# Patient Record
Sex: Male | Born: 1965
Health system: Southern US, Community
[De-identification: ages and names within clinical notes are randomized; demographics above are authoritative.]

## PROBLEM LIST (undated history)

## (undated) DIAGNOSIS — D649 Anemia, unspecified: Secondary | ICD-10-CM

## (undated) DIAGNOSIS — I10 Essential (primary) hypertension: Secondary | ICD-10-CM

## (undated) DIAGNOSIS — E237 Disorder of pituitary gland, unspecified: Secondary | ICD-10-CM

## (undated) HISTORY — PX: AMPUTATION FINGER: SHX6594

## (undated) HISTORY — PX: SINUS EXPLORATION: SHX5214

---

## 2020-05-20 ENCOUNTER — Telehealth (HOSPITAL_BASED_OUTPATIENT_CLINIC_OR_DEPARTMENT_OTHER): Payer: Self-pay | Admitting: Emergency Medicine

## 2020-05-20 ENCOUNTER — Encounter (HOSPITAL_BASED_OUTPATIENT_CLINIC_OR_DEPARTMENT_OTHER): Payer: Self-pay | Admitting: Emergency Medicine

## 2020-05-20 ENCOUNTER — Inpatient Hospital Stay (HOSPITAL_BASED_OUTPATIENT_CLINIC_OR_DEPARTMENT_OTHER)
Admission: EM | Admit: 2020-05-20 | Discharge: 2020-05-23 | DRG: 811 | Disposition: A | Discharge status: Home / Self Care | Payer: Self-pay | Attending: Internal Medicine | Admitting: Internal Medicine

## 2020-05-20 ENCOUNTER — Inpatient Hospital Stay (HOSPITAL_COMMUNITY): Payer: Self-pay

## 2020-05-20 ENCOUNTER — Other Ambulatory Visit: Payer: Self-pay

## 2020-05-20 ENCOUNTER — Emergency Department (HOSPITAL_BASED_OUTPATIENT_CLINIC_OR_DEPARTMENT_OTHER): Payer: Self-pay

## 2020-05-20 DIAGNOSIS — Z882 Allergy status to sulfonamides status: Secondary | ICD-10-CM

## 2020-05-20 DIAGNOSIS — I517 Cardiomegaly: Secondary | ICD-10-CM | POA: Diagnosis present

## 2020-05-20 DIAGNOSIS — D569 Thalassemia, unspecified: Principal | ICD-10-CM | POA: Diagnosis present

## 2020-05-20 DIAGNOSIS — E669 Obesity, unspecified: Secondary | ICD-10-CM | POA: Diagnosis present

## 2020-05-20 DIAGNOSIS — R778 Other specified abnormalities of plasma proteins: Secondary | ICD-10-CM | POA: Diagnosis present

## 2020-05-20 DIAGNOSIS — I071 Rheumatic tricuspid insufficiency: Secondary | ICD-10-CM | POA: Diagnosis present

## 2020-05-20 DIAGNOSIS — Z79899 Other long term (current) drug therapy: Secondary | ICD-10-CM

## 2020-05-20 DIAGNOSIS — I10 Essential (primary) hypertension: Secondary | ICD-10-CM | POA: Diagnosis present

## 2020-05-20 DIAGNOSIS — D500 Iron deficiency anemia secondary to blood loss (chronic): Secondary | ICD-10-CM | POA: Diagnosis present

## 2020-05-20 DIAGNOSIS — D649 Anemia, unspecified: Secondary | ICD-10-CM | POA: Diagnosis present

## 2020-05-20 DIAGNOSIS — K2971 Gastritis, unspecified, with bleeding: Secondary | ICD-10-CM | POA: Diagnosis present

## 2020-05-20 DIAGNOSIS — K254 Chronic or unspecified gastric ulcer with hemorrhage: Secondary | ICD-10-CM | POA: Diagnosis present

## 2020-05-20 DIAGNOSIS — Z91018 Allergy to other foods: Secondary | ICD-10-CM

## 2020-05-20 DIAGNOSIS — Z6834 Body mass index (BMI) 34.0-34.9, adult: Secondary | ICD-10-CM

## 2020-05-20 DIAGNOSIS — K253 Acute gastric ulcer without hemorrhage or perforation: Secondary | ICD-10-CM

## 2020-05-20 DIAGNOSIS — Z87891 Personal history of nicotine dependence: Secondary | ICD-10-CM

## 2020-05-20 DIAGNOSIS — Z791 Long term (current) use of non-steroidal anti-inflammatories (NSAID): Secondary | ICD-10-CM

## 2020-05-20 DIAGNOSIS — K922 Gastrointestinal hemorrhage, unspecified: Secondary | ICD-10-CM

## 2020-05-20 DIAGNOSIS — U071 COVID-19: Secondary | ICD-10-CM | POA: Diagnosis present

## 2020-05-20 HISTORY — DX: Disorder of pituitary gland, unspecified: E23.7

## 2020-05-20 HISTORY — DX: Anemia, unspecified: D64.9

## 2020-05-20 HISTORY — DX: Essential (primary) hypertension: I10

## 2020-05-20 LAB — CBC WITH DIFFERENTIAL/PLATELET
Abs Immature Granulocytes: 0.06 10*3/uL (ref 0.00–0.07)
Eosinophils Absolute: 0.1 10*3/uL (ref 0.0–0.5)
Eosinophils Relative: 1 %
HCT: 12.3 % — ABNORMAL LOW (ref 39.0–52.0)
Hemoglobin: 2.7 g/dL — CL (ref 13.0–17.0)
Immature Granulocytes: 1 %
Lymphocytes Relative: 14 %
Lymphs Abs: 1.3 10*3/uL (ref 0.7–4.0)
MCH: 11.1 pg — ABNORMAL LOW (ref 26.0–34.0)
MCHC: 22 g/dL — ABNORMAL LOW (ref 30.0–36.0)
MCV: 50.4 fL — ABNORMAL LOW (ref 80.0–100.0)
Monocytes Absolute: 0.6 10*3/uL (ref 0.1–1.0)
Monocytes Relative: 6 %
Neutro Abs: 7.2 10*3/uL (ref 1.7–7.7)
Neutrophils Relative %: 79 %
Platelets: 365 10*3/uL (ref 150–400)
RBC: 2.44 MIL/uL — ABNORMAL LOW (ref 4.22–5.81)
RDW: 24.8 % — ABNORMAL HIGH (ref 11.5–15.5)
Smear Review: NORMAL
WBC: 9.2 10*3/uL (ref 4.0–10.5)

## 2020-05-20 LAB — COMPREHENSIVE METABOLIC PANEL
ALT: 12 U/L (ref 0–44)
AST: 16 U/L (ref 15–41)
Albumin: 3.2 g/dL — ABNORMAL LOW (ref 3.5–5.0)
Alkaline Phosphatase: 63 U/L (ref 38–126)
Anion gap: 11 (ref 5–15)
BUN: 33 mg/dL — ABNORMAL HIGH (ref 6–20)
CO2: 20 mmol/L — ABNORMAL LOW (ref 22–32)
Calcium: 8.2 mg/dL — ABNORMAL LOW (ref 8.9–10.3)
Chloride: 104 mmol/L (ref 98–111)
Creatinine, Ser: 0.79 mg/dL (ref 0.61–1.24)
GFR calc Af Amer: >60 mL/min (ref >60)
GFR calc non Af Amer: >60 mL/min (ref >60)
Glucose, Bld: 121 mg/dL — ABNORMAL HIGH (ref 70–99)
Potassium: 4.1 mmol/L (ref 3.5–5.1)
Sodium: 135 mmol/L (ref 135–145)
Total Bilirubin: 0.9 mg/dL (ref 0.3–1.2)
Total Protein: 6.3 g/dL — ABNORMAL LOW (ref 6.5–8.1)

## 2020-05-20 LAB — CBC
HCT: 11.8 % — ABNORMAL LOW (ref 39.0–52.0)
Hemoglobin: 2.7 g/dL — CL (ref 13.0–17.0)
MCH: 11.4 pg — ABNORMAL LOW (ref 26.0–34.0)
MCHC: 22.9 g/dL — ABNORMAL LOW (ref 30.0–36.0)
MCV: 50 fL — ABNORMAL LOW (ref 80.0–100.0)
Platelets: 356 10*3/uL (ref 150–400)
RBC: 2.36 MIL/uL — ABNORMAL LOW (ref 4.22–5.81)
RDW: 24.6 % — ABNORMAL HIGH (ref 11.5–15.5)
WBC: 8.6 10*3/uL (ref 4.0–10.5)
nRBC: 0.3 % — ABNORMAL HIGH (ref 0.0–0.2)

## 2020-05-20 LAB — URINALYSIS, ROUTINE W REFLEX MICROSCOPIC
Bilirubin Urine: NEGATIVE
Glucose, UA: NEGATIVE mg/dL
Hgb urine dipstick: NEGATIVE
Ketones, ur: NEGATIVE mg/dL
Leukocytes,Ua: NEGATIVE
Nitrite: NEGATIVE
Protein, ur: NEGATIVE mg/dL
Specific Gravity, Urine: <1.005 — ABNORMAL LOW (ref 1.005–1.030)
pH: 5.5 (ref 5.0–8.0)

## 2020-05-20 LAB — OCCULT BLOOD X 1 CARD TO LAB, STOOL: Fecal Occult Bld: POSITIVE — AB

## 2020-05-20 LAB — ABO/RH: ABO/RH(D): A POS

## 2020-05-20 LAB — PROTIME-INR
INR: 1.2 (ref 0.8–1.2)
Prothrombin Time: 14.9 seconds (ref 11.4–15.2)

## 2020-05-20 LAB — LIPASE, BLOOD: Lipase: 22 U/L (ref 11–51)

## 2020-05-20 LAB — SARS CORONAVIRUS 2 BY RT PCR (HOSPITAL ORDER, PERFORMED IN ~~LOC~~ HOSPITAL LAB): SARS Coronavirus 2: POSITIVE — AB

## 2020-05-20 MED ORDER — DIPHENHYDRAMINE HCL 25 MG PO CAPS
25.0000 mg | ORAL_CAPSULE | Freq: Once | ORAL | Status: AC
  Administered 2020-05-21: 25 mg via ORAL
  Filled 2020-05-20: qty 1

## 2020-05-20 MED ORDER — SODIUM CHLORIDE 0.9 % IV SOLN
8.0000 mg/h | INTRAVENOUS | Status: DC
  Administered 2020-05-21 – 2020-05-23 (×4): 8 mg/h via INTRAVENOUS
  Filled 2020-05-20 (×7): qty 80

## 2020-05-20 MED ORDER — MORPHINE SULFATE (PF) 4 MG/ML IV SOLN
4.0000 mg | Freq: Once | INTRAVENOUS | Status: AC
  Administered 2020-05-20: 4 mg via INTRAVENOUS
  Filled 2020-05-20: qty 1

## 2020-05-20 MED ORDER — PANTOPRAZOLE SODIUM 40 MG IV SOLR: 40.0000 mg | Freq: Two times a day (BID) | INTRAVENOUS | Status: DC

## 2020-05-20 MED ORDER — SODIUM CHLORIDE 0.9% IV SOLUTION: Freq: Once | INTRAVENOUS | Status: AC

## 2020-05-20 MED ORDER — IOHEXOL 300 MG/ML  SOLN
100.0000 mL | Freq: Once | INTRAMUSCULAR | Status: AC | PRN
  Administered 2020-05-20: 100 mL via INTRAVENOUS

## 2020-05-20 MED ORDER — ONDANSETRON HCL 4 MG/2ML IJ SOLN
4.0000 mg | Freq: Once | INTRAMUSCULAR | Status: AC
  Administered 2020-05-20: 4 mg via INTRAVENOUS
  Filled 2020-05-20: qty 2

## 2020-05-20 MED ORDER — ACETAMINOPHEN 325 MG PO TABS
650.0000 mg | ORAL_TABLET | Freq: Once | ORAL | Status: AC
  Administered 2020-05-21: 650 mg via ORAL
  Filled 2020-05-20: qty 2

## 2020-05-20 MED ORDER — SODIUM CHLORIDE 0.9% FLUSH
3.0000 mL | Freq: Once | INTRAVENOUS | Status: DC
  Filled 2020-05-20: qty 3

## 2020-05-20 MED ORDER — PANTOPRAZOLE SODIUM 40 MG IV SOLR
INTRAVENOUS | Status: AC
  Administered 2020-05-20: 80 mg
  Filled 2020-05-20: qty 80

## 2020-05-20 MED ORDER — SODIUM CHLORIDE 0.9 % IV SOLN
80.0000 mg | Freq: Once | INTRAVENOUS | Status: AC
  Administered 2020-05-20: 18:00:00 80 mg via INTRAVENOUS
  Filled 2020-05-20: qty 80

## 2020-05-20 NOTE — Plan of Care (Addendum)
54 yo thalasemia Hg 2.7 reports fatigue and abdominal pain  hemocult positive CT abd neg protonix 80 mg  No ETOH Recommended consult to oncology [facility] through smartchat ED to ED transfer to [facility] for transfusion Please page TRH on arrival to ER  accept to stepdown, [facility] Shawn Hamilton 7:38 PM  ER provider spoke to  Dr. Clelia Croft who said normal transfusion is fine.  Please reconsult if questions. 7:54 PM

## 2020-05-20 NOTE — H&P (Signed)
Shawn Hamilton [id number] DOB: [birth year] DOA: 05/20/2020     PCP: Patient, No Pcp Per   Outpatient Specialists:   NONE    Patient arrived to ER on 05/20/20 at 1454 Referred by Attending Therisa Doyneoutova, Amisha Pospisil, MD   Patient coming from: home Lives alone,     Chief Complaint:   Chief Complaint  Patient presents with  . Abdominal Pain  . Emesis    HPI: Shawn DickensCraig Hamilton is a 54 y.o. male with medical history significant of  thalasemia     Presented with   nausea vomiting abdominal pain and shortness of breath He has become so tired he has hard time walking to his car.  Patient states that he has not had any insurance so he could not check himself for quite some time.  He has been vomiting black material and that had triggered him to come to emergency department.  He has been taking Aleve's every 2 hours for the past few years for shoulder pain.  He is unsure if he had any melena because he does not look. Patient has known history of anemia but not sure what his hemoglobin usually is at Patient travels and have been in the area only for the past 2 wks He has congenital absence of the right hand Infectious risk factors:  Reports  shortness of breath  N/V abdominal pain,   severe fatigue    Has  NOt been vaccinated against COVID (not interested)   Initial COVID TEST   POSITIVE,     Lab Results  Component Value Date   SARSCOV2NAA POSITIVE (A) 05/20/2020     Regarding pertinent Chronic problems:     HTN   not on medications   Chronic anemia -due to thalassemia unclear what his baseline hemoglobin is Recent Labs    05/20/20 1511 05/20/20 1554  HGB 2.7* 2.7*    While in ER: Noted to have hemoglobin down to 2.7 and Hemoccult positive from below.   ER Provider Called: Oncology   Dr. Clelia CroftShadad They Recommend admit to medicine transfuse is not any specific changes. Reconsult in a.m. if needed  ER Provider smart [facility] msg  Gi    Will see in AM     Hospitalist was called for  admission for symptomatic anemia incidental Covid infection and upper GI bleed  The following Work up has been ordered so far:  Orders Placed This Encounter  Procedures  . SARS Coronavirus 2 by RT PCR (hospital order, [facility]) Nasopharyngeal Nasopharyngeal Swab  . CT ABDOMEN PELVIS W CONTRAST  . Lipase, blood  . Comprehensive metabolic panel  . CBC  . Urinalysis, Routine w reflex microscopic  . CBC with Differential  . Occult blood card to lab, stool Provider will collect  . Protime-INR  . Vitamin B12  . Folate  . Iron and TIBC  . Ferritin  . Reticulocytes  . HIV Antibody (routine testing w rflx)  . C-reactive protein  . D-dimer, quantitative (not at [facility])  . Fibrinogen  . Lactate dehydrogenase  . Procalcitonin  . CBC  . Diet NPO time specified  . Saline Lock IV, Maintain IV access  . Cardiac monitoring  . Novel Coronavirus PPE supplies (droplet and contact precautions) yellow stethoscopes, surgical mask, gowns, surgical caps, face shield, goggles, CAPR - on the floor/unit, cleaning [employer]oth (orange and purple top)  . Place COVID-19 isolation sign and PPE checklist outside the DOOR  . Do not give nonsteroidal anti-inflammatory drugs (NSAIDs)  .  Patient to wear surgical mask during transportation  . Place working phone next to the patient  . Initiate Oral Care Protocol  . Initiate Carrier Fluid Protocol  . Cardiac Monitoring - Continuous Indefinite  . CBC post transfusion - RN to place lab order with appropriate draw time  . Consult to hospitalist  ALL PATIENTS BEING ADMITTED/HAVING PROCEDURES NEED COVID-19 SCREENING Return call to [phone]  . Consult to hematology  ALL PATIENTS BEING ADMITTED/HAVING PROCEDURES NEED COVID-19 SCREENING  . Airborne and Contact precautions  . Incentive spirometry  . Flutter valve  . EKG 12-Lead  . EKG 12-Lead  . Type and screen [facility]  . ABO/Rh  . Prepare RBC (crossmatch)  . Insert  saline lock  . Admit to Inpatient (patient's expected length of stay will be greater than 2 midnights or inpatient only procedure)  . Admit to Inpatient (patient's expected length of stay will be greater than 2 midnights or inpatient only procedure)    Following Medications were ordered in ER: Medications  sodium chloride flush (NS) 0.9 % injection 3 mL (3 mLs Intravenous Not Given 05/20/20 1511)  pantoprazole (PROTONIX) 80 mg in sodium chloride 0.9 % 100 mL (0.8 mg/mL) infusion (has no administration in time range)  pantoprazole (PROTONIX) injection 40 mg (has no administration in time range)  0.9 %  sodium chloride infusion (Manually program via Guardrails IV Fluids) (0 mLs Intravenous Hold 05/20/20 2340)  acetaminophen (TYLENOL) tablet 650 mg (has no administration in time range)  diphenhydrAMINE (BENADRYL) capsule 25 mg (has no administration in time range)  morphine 4 MG/ML injection 4 mg (4 mg Intravenous Given 05/20/20 2036)  ondansetron (ZOFRAN) injection 4 mg (4 mg Intravenous Given 05/20/20 2036)  iohexol (OMNIPAQUE) 300 MG/ML solution 100 mL (100 mLs Intravenous Contrast Given 05/20/20 1734)  pantoprazole (PROTONIX) 80 mg in sodium chloride 0.9 % 100 mL IVPB (0 mg Intravenous Stopped 05/20/20 1847)  pantoprazole (PROTONIX) 40 MG injection (80 mg  Given 05/20/20 1803)        Consult Orders  (From admission, onward)         Start     Ordered   05/20/20 1814  Consult to hospitalist  ALL PATIENTS BEING ADMITTED/HAVING PROCEDURES NEED COVID-19 SCREENING Return call to [phone] Called carelink spoke with Cala Bradford  Once       Comments: ALL PATIENTS BEING ADMITTED/HAVING PROCEDURES NEED COVID-19 SCREENING  Return call to [phone]  Provider:  (Not yet assigned)  Question Answer Comment  Place call to: [facility] (Return call to [phone])   Reason for Consult Admit      05/20/20 1819           Significant initial  Findings: Abnormal Labs Reviewed  SARS CORONAVIRUS 2 BY RT PCR  (HOSPITAL ORDER, PERFORMED IN [facility] LAB) - Abnormal; Notable for the following components:      Result Value   SARS Coronavirus 2 POSITIVE (*)    All other components within normal limits  COMPREHENSIVE METABOLIC PANEL - Abnormal; Notable for the following components:   CO2 20 (*)    Glucose, Bld 121 (*)    BUN 33 (*)    Calcium 8.2 (*)    Total Protein 6.3 (*)    Albumin 3.2 (*)    All other components within normal limits  CBC - Abnormal; Notable for the following components:   RBC 2.36 (*)    Hemoglobin 2.7 (*)    HCT 11.8 (*)    MCV 50.0 (*)  MCH 11.4 (*)    MCHC 22.9 (*)    RDW 24.6 (*)    nRBC 0.3 (*)    All other components within normal limits  URINALYSIS, ROUTINE W REFLEX MICROSCOPIC - Abnormal; Notable for the following components:   Specific Gravity, Urine <1.005 (*)    All other components within normal limits  CBC WITH DIFFERENTIAL/PLATELET - Abnormal; Notable for the following components:   RBC 2.44 (*)    Hemoglobin 2.7 (*)    HCT 12.3 (*)    MCV 50.4 (*)    MCH 11.1 (*)    MCHC 22.0 (*)    RDW 24.8 (*)    All other components within normal limits  OCCULT BLOOD X 1 CARD TO LAB, STOOL - Abnormal; Notable for the following components:   Fecal Occult Bld POSITIVE (*)    All other components within normal limits     Otherwise labs showing:     Recent Labs  Lab 05/20/20 1511  NA 135  K 4.1  CO2 20*  GLUCOSE 121*  BUN 33*  CREATININE 0.79  CALCIUM 8.2*    Cr   stable,   Lab Results  Component Value Date   CREATININE 0.79 05/20/2020    Recent Labs  Lab 05/20/20 1511  AST 16  ALT 12  ALKPHOS 63  BILITOT 0.9  PROT 6.3*  ALBUMIN 3.2*   Lab Results  Component Value Date   CALCIUM 8.2 (L) 05/20/2020    WBC      Component Value Date/Time   WBC 9.2 05/20/2020 1554   ANC    Component Value Date/Time   NEUTROABS 7.2 05/20/2020 1554   ALC No components found for: LYMPHAB    Plt: Lab Results  Component Value Date    PLT 365 05/20/2020      Procalcitonin  Ordered   COVID-19 Labs  No results for input(s): DDIMER, FERRITIN, LDH, CRP in the last 72 hours.  Lab Results  Component Value Date   SARSCOV2NAA POSITIVE (A) 05/20/2020   HG/HCT    Down     Component Value Date/Time   HGB 2.7 (LL) 05/20/2020 1554   HCT 12.3 (L) 05/20/2020 1554    Recent Labs  Lab 05/20/20 1511  LIPASE 22   No results for input(s): AMMONIA in the last 168 hours.    Troponin   ordered     ECG: Ordered Personally reviewed by me showing: HR : 93 Rhythm:  NSR   no evidence of ischemic changes QTC 466    UA  no evidence of UTI     Urine analysis:    Component Value Date/Time   COLORURINE YELLOW 05/20/2020 1511   APPEARANCEUR CLEAR 05/20/2020 1511   LABSPEC <1.005 (L) 05/20/2020 1511   PHURINE 5.5 05/20/2020 1511   GLUCOSEU NEGATIVE 05/20/2020 1511   HGBUR NEGATIVE 05/20/2020 1511   BILIRUBINUR NEGATIVE 05/20/2020 1511   KETONESUR NEGATIVE 05/20/2020 1511   PROTEINUR NEGATIVE 05/20/2020 1511   NITRITE NEGATIVE 05/20/2020 1511   LEUKOCYTESUR NEGATIVE 05/20/2020 1511       Ordered   CXR - NON acute  CTabd/pelvis -   nonacute      ED Triage Vitals  Enc Vitals Group     BP 05/20/20 1507 (!) 170/76     Pulse Rate 05/20/20 1507 90     Resp 05/20/20 1507 18     Temp 05/20/20 1507 98.4 F (36.9 C)     Temp Source 05/20/20 1507 Oral  SpO2 05/20/20 1507 100 %     Weight 05/20/20 1508 235 lb (106.6 kg)     Height 05/20/20 1508 5\' 9"  (1.753 m)     Head Circumference --      Peak Flow --      Pain Score 05/20/20 1508 5     Pain Loc --      Pain Edu? --      Excl. in GC? --   TMAX(24)@       Latest  Blood pressure (!) 162/42, pulse 97, temperature 98.4 F (36.9 C), resp. rate 20, height 5\' 9"  (1.753 m), weight 106.6 kg, SpO2 98 %.    Review of Systems:    Pertinent positives include: , fatigue, abdominal pain, nausea, vomiting,  hematemesis Constitutional:  No weight loss, night sweats,  Fevers, chillsweight loss  HEENT:  No headaches, Difficulty swallowing,Tooth/dental problems,Sore throat,  No sneezing, itching, ear ache, nasal congestion, post nasal drip,  Cardio-vascular:  No chest pain, Orthopnea, PND, anasarca, dizziness, palpitations.no Bilateral lower extremity swelling  GI:  No heartburn, indigestion,  diarrhea, change in bowel habits, loss of appetite, melena, blood in stool, Resp:  no shortness of breath at rest. No dyspnea on exertion, No excess mucus, no productive cough, No non-productive cough, No coughing up of blood.No change in color of mucus.No wheezing. Skin:  no rash or lesions. No jaundice GU:  no dysuria, change in color of urine, no urgency or frequency. No straining to urinate.  No flank pain.  Musculoskeletal:  No joint pain or no joint swelling. No decreased range of motion. No back pain.  Psych:  No change in mood or affect. No depression or anxiety. No memory loss.  Neuro: no localizing neurological complaints, no tingling, no weakness, no double vision, no gait abnormality, no slurred speech, no confusion  All systems reviewed and apart from HOPI all are negative  Past Medical History:   Past Medical History:  Diagnosis Date  . Anemia   . Hypertension   . 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 AMPUTATION FINGER    . SINUS EXPLORATION      Social History:  Ambulatory    Independently     reports that he has quit smoking. He has never used smokeless tobacco. He reports current alcohol use. He reports that he does not use drugs.     Family History:   Family History  Problem Relation Age of Onset  . Diabetes Neg Hx   . Hypertension Neg Hx      Allergies: Allergies  Allergen Reactions  . Alfalfa Itching  . Sulfa Antibiotics Other (See Comments)    Upset stomach      Prior to Admission medications   Medication Sig Start Date End Date Taking? Authorizing Provider   acetaminophen (TYLENOL) 500 MG tablet Take 1,000 mg by mouth every 6 (six) hours as needed for mild pain.   Yes [provider]  Doxylamine Succinate, Sleep, (SLEEP AID PO) Take 1 tablet by mouth at bedtime as needed (sleep). Generic "Sleep-ease"   Yes [provider]  naproxen sodium (ALEVE) 220 MG tablet Take 880 mg by mouth 2 (two) times daily as needed (pain).   Yes [provider]  Simethicone (GAS-X PO) Take 1 tablet by mouth daily as needed (gas relief).   Yes [provider]   Physical Exam: Vitals with BMI 05/20/2020 05/20/2020 05/20/2020  Height - - -  Weight - - -  BMI - - -  Systolic 162 - 145  Diastolic 42 - 62  Pulse 97 99 98     1. General:  in No Acute distress   Chronically ill  -appearing 2. Psychological: Alert and  Oriented 3. Head/ENT:     Dry Mucous Membranes pale                          Head Non traumatic, neck supple                           Poor Dentition 4. SKIN:   decreased Skin turgor,  Skin clean Dry and intact no rash 5. Heart: Regular rate and rhythm no   Murmur, no Rub or gallop 6. Lungs:  no wheezes or crackles   7. Abdomen: Soft,  non-tender, Non distended   obese  8. Lower extremities: no clubbing, cyanosis, no   edema 9. Neurologically Grossly intact, moving all 4 extremities equally  10. MSK: Normal range of motion   All other LABS:     Recent Labs  Lab 05/20/20 1511 05/20/20 1554  WBC 8.6 9.2  NEUTROABS  --  7.2  HGB 2.7* 2.7*  HCT 11.8* 12.3*  MCV 50.0* 50.4*  PLT 356 365     Recent Labs  Lab 05/20/20 1511  NA 135  K 4.1  CL 104  CO2 20*  GLUCOSE 121*  BUN 33*  CREATININE 0.79  CALCIUM 8.2*     Recent Labs  Lab 05/20/20 1511  AST 16  ALT 12  ALKPHOS 63  BILITOT 0.9  PROT 6.3*  ALBUMIN 3.2*       Cultures: No results found for: SDES, SPECREQUEST, CULT, REPTSTATUS   Radiological Exams on Admission: CT ABDOMEN PELVIS W CONTRAST  Result Date: 05/20/2020 CLINICAL DATA:   Upper abdominal pain with nausea and vomiting. EXAM: CT ABDOMEN AND PELVIS WITH CONTRAST TECHNIQUE: Multidetector CT imaging of the abdomen and pelvis was performed using the standard protocol following bolus administration of intravenous contrast. CONTRAST:  OMNIPAQUE IOHEXOL 300 MG/ML  SOLN COMPARISON:  None. FINDINGS: Lower chest: No acute abnormality. Hepatobiliary: No focal liver abnormality is seen. No gallstones, gallbladder wall thickening, or biliary dilatation. Pancreas: Unremarkable. No pancreatic ductal dilatation or surrounding inflammatory changes. Spleen: Normal in size without focal abnormality. Adrenals/Urinary Tract: Adrenal glands are unremarkable. Kidneys are normal, without renal calculi, focal lesion, or hydronephrosis. Bladder is unremarkable. Stomach/Bowel: Stomach is within normal limits. The appendix is not clearly identified. No evidence of bowel wall thickening, distention, or inflammatory changes. Vascular/Lymphatic: There is mild calcification of the abdominal aorta and bilateral common iliac arteries without evidence of aneurysmal dilatation. No enlarged abdominal or pelvic lymph nodes. Reproductive: Prostate is unremarkable. Other: No abdominal wall hernia or abnormality. No abdominopelvic ascites. Musculoskeletal: No acute or significant osseous findings. IMPRESSION: 1. No acute intra-abdominal findings. 2. Aortic atherosclerosis. Aortic Atherosclerosis (ICD10-I70.0). Electronically Signed   By: Aram Candela M.D.   On: 05/20/2020 18:07    Chart has been reviewed    Assessment/Plan  54 y.o. male with medical history significant of  thalasemia  Admitted for symptomatic anemia in the setting of upper GI blood loss history for ischemia and incidental finding  Of Covid infection  Present on Admission: . Severe anemia -likely need repeated transfusions.  Continue to follow CBC after blood administration.  Supportive management Will need to establish and  follow-up as  an outpatient regarding history of thalassemia  . COVID-19 virus infection -at this point asymptomatic we will continue to monitor obtain chest x-ray to see if there is any evidence of Covid pneumonia.  Hold off on steroids as patient is symptomatic no evidence of hypoxia or pulmonary infiltrates.  Inflammatory markers unremarkable hold off on remdesivir for now Obtain inflammatory markers.   Monitor for any evidence of significant progression.  Marland Kitchen Upper GI bleed -  - Glasgow Blatchford score BUN >18.2  , Hg  57M  melena     >1 Justifies admission and aggressive management      Modifying risk factors include:    NSAIDS use        -    AIMS 65 =     TOTAL of 0            reassuring           Admit to stepdown given severity of anemia    - ER  Provider msg gastroenterology (  LB) they will see patient in a.m. appreciate their consult   - serial CBC.    - Monitor for any recurrence,  evidence of hemodynamic instability or significant blood loss  - Transfuse  for hemoglobin below 7    - Establish at least 2 PIV and fluid resuscitate   - clear liquids for tonight keep nothing by mouth post midnight,   -  administer Protonix drip    . Essential hypertension -not taking any home medications continue to monitor  . Thalassemia -chronic: Need to establish care with oncology as an outpatient  Cardiomegaly and slightly elevated troponin stable no chest pain.  Continue to monitor obtain echogram Other plan as per orders.  DVT prophylaxis:  SCD     Code Status:    Code Status: Not on file FULL CODE as per patient  I had personally discussed CODE STATUS with patient     Family Communication:   Family not at  Bedside   Disposition Plan:       To home once workup is complete and patient is stable   Following barriers for discharge:                                                          Anemia corrected                                                        Will need to be able to  tolerate PO                            Will likely need home health, home O2, set up                           Will need consultants to evaluate patient prior to discharge  Consults called: Gi aware LB on call  Hematology made aware reconsult if needed    Admission status:  ED Disposition    ED Disposition Condition Comment   Admit  [facility]ospital Area: [facility] [[id number]]  Level of Care: Progressive [102]  Admit to Progressive based on following criteria: MULTISYSTEM THREATS such as stable sepsis, metabolic/electrolyte imbalance with or without encephalopathy that is responding to early treatment.  Admit to Progressive based on following criteria: GI, ENDOCRINE disease patients with GI bleeding, acute liver failure or pancreatitis, stable with diabetic ketoacidosis or thyrotoxicosis (hypothyroid) state.  Covid Evaluation: Confirmed COVID Positive  Diagnosis: Severe anemia [[id number]]  Admitting Physician: Therisa Doyne [[id number]]  Attending Physician: Therisa Doyne [[id number]]  Estimated length of stay: past midnight tomorrow  Certification:: I certify this patient will need inpatient services for at least 2 midnights          inpatient     I Expect 2 midnight stay secondary to severity of patient's current illness need for inpatient interventions justified by the following:     Severe lab/radiological/exam abnormalities including:    severe Anemia and extensive comorbidities including: thalasemia That are currently affecting medical management.   I expect  patient to be hospitalized for 2 midnights requiring inpatient medical care.  Patient is at high risk for adverse outcome (such as loss of life or disability) if not treated.  Indication for inpatient stay as follows:    inability to maintain oral hydration    Need for operative/procedural  intervention    Need for  IV fluids,    Level of care      SDU tele  indefinitely please discontinue once patient no longer qualifies COVID-19 Labs     Lab Results  Component Value Date   SARSCOV2NAA POSITIVE (A) 05/20/2020     Precautions: admitted as covid positive Airborne and Contact precautions    PPE: Used by the provider:   P100  eye Goggles,  Gloves  gown     Shawn Hamilton 05/21/2020, 1:26 AM    [facility]     after 2 AM please page floor coverage PA If 7AM-7PM, please contact the day team taking care of the patient using [email or web address]   Patient was evaluated in the context of the global COVID-19 pandemic, which necessitated consideration that the patient might be at risk for infection with the SARS-CoV-2 virus that causes COVID-19. Institutional protocols and algorithms that pertain to the evaluation of patients at risk for COVID-19 are in a state of rapid change based on information released by regulatory bodies including the CDC and federal and state organizations. These policies and algorithms were followed during the patient's care.

## 2020-05-20 NOTE — ED Notes (Signed)
Report called to Leta Jungling, Press photographer at [employer]

## 2020-05-20 NOTE — ED Provider Notes (Signed)
[facility] EMERGENCY DEPARTMENT Provider Note   CSN: [id number] Arrival date & time: 05/20/20  1454   History Chief Complaint  Patient presents with   Abdominal Pain   Emesis   Shawn Hamilton is a 54 year old man with past medical history of thalasemia who presented to [facility] on 05/20/20 for evaluation of abdominal pain, vomiting, and weakness  He states that for the past year that he has experienced significant generalized weakness and shortness of breath. These symptoms have limited his ability to even walk to his car without becoming fatigued. He states that he has not been evaluated for these complaints due to lacking insurance after losing his job last year. He presents to the ED today after experiencing 3-4 episodes of vomiting black material which was hard for him to quantify. He denies having a past history of vomiting. Since then, he has had cramping abdominal pain in his epigastric region and nausea. Of note, over the past year and a half he has been taking four aleeve tablets approximately every twelve hours, as well as 2g tylenol per day for shoulder pain. He states that he has not looked at his bowel movements recently and cannot comment on whether or not his stools have been bloody.       Past Medical History:  Diagnosis Date   Anemia    Hypertension    Pituitary gland disorder [facility])    Past Surgical History:  Procedure Laterality Date   AMPUTATION FINGER     SINUS EXPLORATION      Family History: Mother, thalasemmia (unspecified), diabetes, HTN, heart disease Father, diabetes, HTN, heart disease  Social History   Tobacco Use   Smoking status: Former Smoker   Smokeless tobacco: Never Used  Substance Use Topics   Alcohol use: Yes    Comment: occasionally   Drug use: Never   Home Medications Prior to Admission medications   Medication Sig Start Date End Date Taking? Authorizing Provider  acetaminophen (TYLENOL) 500 MG  tablet Take 1,000 mg by mouth every 6 (six) hours as needed for mild pain.   Yes [provider]  Doxylamine Succinate, Sleep, (SLEEP AID PO) Take 1 tablet by mouth at bedtime as needed (sleep). Generic "Sleep-ease"   Yes [provider]  naproxen sodium (ALEVE) 220 MG tablet Take 880 mg by mouth 2 (two) times daily as needed (pain).   Yes [provider]  Simethicone (GAS-X PO) Take 1 tablet by mouth daily as needed (gas relief).   Yes [provider]   Allergies    Alfalfa and Sulfa antibiotics  Review of Systems   Review of Systems  Constitutional: Positive for activity change, chills and fatigue. Negative for fever.  HENT: Negative for ear pain and sore throat.   Eyes: Negative for pain and visual disturbance.  Respiratory: Positive for shortness of breath. Negative for cough.   Cardiovascular: Negative for chest pain and palpitations.  Gastrointestinal: Positive for abdominal pain, nausea and vomiting.  Genitourinary: Negative for dysuria and hematuria.  Musculoskeletal: Negative for arthralgias and back pain.  Skin: Positive for pallor. Negative for rash.  Neurological: Positive for light-headedness. Negative for dizziness.  All other systems reviewed and are negative.   Physical Exam Updated Vital Signs BP (!) 145/62    Pulse 99    Temp 98.4 F (36.9 C) (Oral)    Resp (!) 21    Ht 5\' 9"  (1.753 m)    Wt 106.6 kg    SpO2 97%  BMI 34.70 kg/m   Physical Exam Vitals and nursing note reviewed.  Constitutional:      Appearance: He is well-developed. He is obese.  HENT:     Head: Normocephalic and atraumatic.  Eyes:     General: No scleral icterus.    Extraocular Movements: Extraocular movements intact.     Conjunctiva/sclera: Conjunctivae normal.  Cardiovascular:     Rate and Rhythm: Normal rate and regular rhythm.     Heart sounds: Normal heart sounds. No murmur heard.   Pulmonary:     Effort: Pulmonary effort is normal. No  respiratory distress.     Breath sounds: Normal breath sounds.  Abdominal:     General: Abdomen is flat. Bowel sounds are normal. There is no distension.     Palpations: Abdomen is soft.     Tenderness: There is abdominal tenderness in the epigastric area.  Musculoskeletal:     Cervical back: Neck supple.  Skin:    General: Skin is warm and dry.     Coloration: Skin is pale.  Neurological:     General: No focal deficit present.     Mental Status: He is alert and oriented to person, place, and time.  Psychiatric:        Mood and Affect: Mood normal.        Behavior: Behavior normal.    ED Results / Procedures / Treatments   Labs (all labs ordered are listed, but only abnormal results are displayed) Labs Reviewed  COMPREHENSIVE METABOLIC PANEL - Abnormal; Notable for the following components:      Result Value   CO2 20 (*)    Glucose, Bld 121 (*)    BUN 33 (*)    Calcium 8.2 (*)    Total Protein 6.3 (*)    Albumin 3.2 (*)    All other components within normal limits  CBC - Abnormal; Notable for the following components:   RBC 2.36 (*)    Hemoglobin 2.7 (*)    HCT 11.8 (*)    MCV 50.0 (*)    MCH 11.4 (*)    MCHC 22.9 (*)    RDW 24.6 (*)    nRBC 0.3 (*)    All other components within normal limits  URINALYSIS, ROUTINE W REFLEX MICROSCOPIC - Abnormal; Notable for the following components:   Specific Gravity, Urine <1.005 (*)    All other components within normal limits  CBC WITH DIFFERENTIAL/PLATELET - Abnormal; Notable for the following components:   RBC 2.44 (*)    Hemoglobin 2.7 (*)    HCT 12.3 (*)    MCV 50.4 (*)    MCH 11.1 (*)    MCHC 22.0 (*)    RDW 24.8 (*)    All other components within normal limits  OCCULT BLOOD X 1 CARD TO LAB, STOOL - Abnormal; Notable for the following components:   Fecal Occult Bld POSITIVE (*)    All other components within normal limits  SARS CORONAVIRUS 2 BY RT PCR [facility])  LIPASE,  BLOOD  PROTIME-INR   EKG EKG Interpretation  Date/Time:  Friday May 20 2020 15:46:32 EDT Ventricular Rate:  93 PR Interval:    QRS Duration: 81 QT Interval:  374 QTC Calculation: 466 R Axis:   50 Text Interpretation: Sinus rhythm Borderline repolarization abnormality No previous tracing Confirmed by Gwyneth SproutPlunkett, Whitney ([id number]) on 05/20/2020 3:57:42 PM  Radiology CT ABDOMEN PELVIS W CONTRAST  Result Date: 05/20/2020 CLINICAL DATA:  Upper  abdominal pain with nausea and vomiting. EXAM: CT ABDOMEN AND PELVIS WITH CONTRAST TECHNIQUE: Multidetector CT imaging of the abdomen and pelvis was performed using the standard protocol following bolus administration of intravenous contrast. CONTRAST:  OMNIPAQUE IOHEXOL 300 MG/ML  SOLN COMPARISON:  None. FINDINGS: Lower chest: No acute abnormality. Hepatobiliary: No focal liver abnormality is seen. No gallstones, gallbladder wall thickening, or biliary dilatation. Pancreas: Unremarkable. No pancreatic ductal dilatation or surrounding inflammatory changes. Spleen: Normal in size without focal abnormality. Adrenals/Urinary Tract: Adrenal glands are unremarkable. Kidneys are normal, without renal calculi, focal lesion, or hydronephrosis. Bladder is unremarkable. Stomach/Bowel: Stomach is within normal limits. The appendix is not clearly identified. No evidence of bowel wall thickening, distention, or inflammatory changes. Vascular/Lymphatic: There is mild calcification of the abdominal aorta and bilateral common iliac arteries without evidence of aneurysmal dilatation. No enlarged abdominal or pelvic lymph nodes. Reproductive: Prostate is unremarkable. Other: No abdominal wall hernia or abnormality. No abdominopelvic ascites. Musculoskeletal: No acute or significant osseous findings. IMPRESSION: 1. No acute intra-abdominal findings. 2. Aortic atherosclerosis. Aortic Atherosclerosis (ICD10-I70.0). Electronically Signed   By: Aram Candela M.D.   On: 05/20/2020  18:07    Procedures Procedures (including critical care time)  Medications Ordered in ED Medications  sodium chloride flush (NS) 0.9 % injection 3 mL (3 mLs Intravenous Not Given 05/20/20 1511)  morphine 4 MG/ML injection 4 mg (0 mg Intravenous Hold 05/20/20 1639)  ondansetron (ZOFRAN) injection 4 mg (0 mg Intravenous Hold 05/20/20 1639)  iohexol (OMNIPAQUE) 300 MG/ML solution 100 mL (100 mLs Intravenous Contrast Given 05/20/20 1734)  pantoprazole (PROTONIX) 80 mg in sodium chloride 0.9 % 100 mL IVPB (0 mg Intravenous Stopped 05/20/20 1847)  pantoprazole (PROTONIX) 40 MG injection (80 mg  Given 05/20/20 1803)   ED Course  I have reviewed the triage vital signs and the nursing notes.  Pertinent labs & imaging results that were available during my care of the patient were reviewed by me and considered in my medical decision making (see chart for details).    MDM Rules/Calculators/A&P                          Shawn Hamilton is a 54 year old man with past medical history of thalasemmia who presented to the ED with weakness and shortness of breath over the past year and new-onset hematemesis. On arrival to ED, he is hemodynamically stable, and endorses mild epigastric abdominal pain with nausea. Physical exam was significant for epigastric tenderness and generalized pallor. Labs reveal significant microcytic anemia (Hb 2.7, MCV 50.0). Hemoccult positive. CMP reveals BUN 33 and Cr 0.79 with BUN/Cr ratio greater than 30. Otherwise lipase and UA are unremarkable. CT Abdomen/Pelvis unremarkable with no acute intra-abdominal findings. Given the constellation of his subjective complaints, physical exam and laboratory findings, gastrointestinal bleed is the likely cause of his significant anemia, weakness and black vomitus. The etiology of his GI bleed is most concerning for bleeding peptic ulcer given his history of significant NSAID use over the past year in the setting of his shoulder pain, along with epigastric pain  and tenderness to palpation on exam. Alternatively, esophageal varices were considered, however, he denies frequent alcohol use, has no history of chronic liver disease, and his hepatic function appears stable on CMP. He is receiving 80mg  IV protonix now. He has been offered morphine and Zofran, however, he denies needing these medications at this time. We will hold off transfusing at this time given his history  of thalassemia and hemodynamic stability.  Discussed patient's case with hospitalist who recommended discussing with Hem/Onc whether he will require any additional workup prior to receiving blood transfusion. Hem/Onc stated that there is no further workup required and he can receive transfusion per regular protocol. He will require ED to ED transfer to [facility] Emergency Department with subsequent type and screen for blood transfusion. Additionally, we have informed GI of the patient who plans to see the patient tomorrow morning. Shawn Hamilton is agreeable with the plan and he has no further questions or concerns.  Final Clinical Impression(s) / ED Diagnoses Final diagnoses:  Symptomatic anemia  Gastrointestinal hemorrhage, unspecified gastrointestinal hemorrhage type    Rx / DC Orders ED Discharge Orders    None       Jasmine December, MD 05/20/20 1950    Gwyneth Sprout, MD 05/20/20 [phone]

## 2020-05-20 NOTE — ED Triage Notes (Signed)
Reports having abdominal pain and n/v for at least 3 months.  Reports being new to the area.  Currently not taking any prescription medications.  Does endorse taking aleve and ibuprofen for shoulder pain.  This has also been going on for 3 months as well.  Does report having black vomit this morning.

## 2020-05-20 NOTE — ED Notes (Signed)
Pt. In no  Distress and is alert and orineted

## 2020-05-21 ENCOUNTER — Other Ambulatory Visit (HOSPITAL_COMMUNITY): Payer: Self-pay

## 2020-05-21 ENCOUNTER — Encounter (HOSPITAL_COMMUNITY): Payer: Self-pay | Admitting: Internal Medicine

## 2020-05-21 ENCOUNTER — Encounter (HOSPITAL_COMMUNITY): Payer: Self-pay | Admitting: Certified Registered Nurse Anesthetist

## 2020-05-21 DIAGNOSIS — K922 Gastrointestinal hemorrhage, unspecified: Secondary | ICD-10-CM

## 2020-05-21 DIAGNOSIS — D649 Anemia, unspecified: Secondary | ICD-10-CM

## 2020-05-21 DIAGNOSIS — I517 Cardiomegaly: Secondary | ICD-10-CM | POA: Diagnosis present

## 2020-05-21 LAB — CBC
HCT: 20 % — ABNORMAL LOW (ref 39.0–52.0)
Hemoglobin: 5.7 g/dL — CL (ref 13.0–17.0)
MCH: 18.5 pg — ABNORMAL LOW (ref 26.0–34.0)
MCHC: 28.5 g/dL — ABNORMAL LOW (ref 30.0–36.0)
MCV: 64.9 fL — ABNORMAL LOW (ref 80.0–100.0)
Platelets: 276 10*3/uL (ref 150–400)
RBC: 3.08 MIL/uL — ABNORMAL LOW (ref 4.22–5.81)
WBC: 8 10*3/uL (ref 4.0–10.5)
nRBC: 0.5 % — ABNORMAL HIGH (ref 0.0–0.2)

## 2020-05-21 LAB — RETICULOCYTES
Immature Retic Fract: 39.7 % — ABNORMAL HIGH (ref 2.3–15.9)
RBC.: 2.16 MIL/uL — ABNORMAL LOW (ref 4.22–5.81)
Retic Count, Absolute: 97 10*3/uL (ref 19.0–186.0)
Retic Ct Pct: 4.5 % — ABNORMAL HIGH (ref 0.4–3.1)

## 2020-05-21 LAB — PROCALCITONIN: Procalcitonin: <0.1 ng/mL

## 2020-05-21 LAB — PROTIME-INR
INR: 1.2 (ref 0.8–1.2)
Prothrombin Time: 14.9 seconds (ref 11.4–15.2)

## 2020-05-21 LAB — COMPREHENSIVE METABOLIC PANEL
ALT: 12 U/L (ref 0–44)
AST: 16 U/L (ref 15–41)
Albumin: 3.3 g/dL — ABNORMAL LOW (ref 3.5–5.0)
Alkaline Phosphatase: 57 U/L (ref 38–126)
Anion gap: 10 (ref 5–15)
BUN: 26 mg/dL — ABNORMAL HIGH (ref 6–20)
CO2: 21 mmol/L — ABNORMAL LOW (ref 22–32)
Calcium: 8.2 mg/dL — ABNORMAL LOW (ref 8.9–10.3)
Chloride: 109 mmol/L (ref 98–111)
Creatinine, Ser: 0.91 mg/dL (ref 0.61–1.24)
GFR calc Af Amer: >60 mL/min (ref >60)
GFR calc non Af Amer: >60 mL/min (ref >60)
Glucose, Bld: 127 mg/dL — ABNORMAL HIGH (ref 70–99)
Potassium: 4.2 mmol/L (ref 3.5–5.1)
Sodium: 140 mmol/L (ref 135–145)
Total Bilirubin: 2.5 mg/dL — ABNORMAL HIGH (ref 0.3–1.2)
Total Protein: 6 g/dL — ABNORMAL LOW (ref 6.5–8.1)

## 2020-05-21 LAB — IRON AND TIBC
Iron: 6 ug/dL — ABNORMAL LOW (ref 45–182)
Saturation Ratios: 1 % — ABNORMAL LOW (ref 17.9–39.5)
TIBC: 459 ug/dL — ABNORMAL HIGH (ref 250–450)
UIBC: 453 ug/dL

## 2020-05-21 LAB — CBC WITH DIFFERENTIAL/PLATELET
Abs Immature Granulocytes: 0.05 10*3/uL (ref 0.00–0.07)
Basophils Absolute: 0.1 10*3/uL (ref 0.0–0.1)
Basophils Relative: 1 %
Eosinophils Absolute: 0.2 10*3/uL (ref 0.0–0.5)
Eosinophils Relative: 2 %
HCT: 20.1 % — ABNORMAL LOW (ref 39.0–52.0)
Hemoglobin: 5.8 g/dL — CL (ref 13.0–17.0)
Immature Granulocytes: 1 %
Lymphocytes Relative: 17 %
Lymphs Abs: 1.4 10*3/uL (ref 0.7–4.0)
MCH: 18.6 pg — ABNORMAL LOW (ref 26.0–34.0)
MCHC: 28.9 g/dL — ABNORMAL LOW (ref 30.0–36.0)
MCV: 64.6 fL — ABNORMAL LOW (ref 80.0–100.0)
Monocytes Absolute: 0.6 10*3/uL (ref 0.1–1.0)
Monocytes Relative: 8 %
Neutro Abs: 5.7 10*3/uL (ref 1.7–7.7)
Neutrophils Relative %: 71 %
Platelets: 285 10*3/uL (ref 150–400)
RBC: 3.11 MIL/uL — ABNORMAL LOW (ref 4.22–5.81)
WBC: 8 10*3/uL (ref 4.0–10.5)
nRBC: 0.5 % — ABNORMAL HIGH (ref 0.0–0.2)

## 2020-05-21 LAB — C-REACTIVE PROTEIN: CRP: 0.5 mg/dL (ref <1.0)

## 2020-05-21 LAB — TROPONIN I (HIGH SENSITIVITY)
Troponin I (High Sensitivity): 24 ng/L — ABNORMAL HIGH (ref <18)
Troponin I (High Sensitivity): 26 ng/L — ABNORMAL HIGH (ref <18)

## 2020-05-21 LAB — TSH: TSH: 2.631 u[IU]/mL (ref 0.350–4.500)

## 2020-05-21 LAB — HIV ANTIBODY (ROUTINE TESTING W REFLEX): HIV Screen 4th Generation wRfx: NONREACTIVE

## 2020-05-21 LAB — PREPARE RBC (CROSSMATCH)

## 2020-05-21 LAB — FERRITIN: Ferritin: 2 ng/mL — ABNORMAL LOW (ref 24–336)

## 2020-05-21 LAB — VITAMIN B12: Vitamin B-12: 371 pg/mL (ref 180–914)

## 2020-05-21 LAB — LACTATE DEHYDROGENASE: LDH: 118 U/L (ref 98–192)

## 2020-05-21 LAB — FOLATE: Folate: 10.9 ng/mL (ref >5.9)

## 2020-05-21 LAB — FIBRINOGEN: Fibrinogen: 293 mg/dL (ref 210–475)

## 2020-05-21 LAB — MAGNESIUM: Magnesium: 2.7 mg/dL — ABNORMAL HIGH (ref 1.7–2.4)

## 2020-05-21 LAB — PHOSPHORUS: Phosphorus: 3.8 mg/dL (ref 2.5–4.6)

## 2020-05-21 LAB — D-DIMER, QUANTITATIVE: D-Dimer, Quant: 0.28 ug/mL-FEU (ref 0.00–0.50)

## 2020-05-21 MED ORDER — ONDANSETRON HCL 4 MG PO TABS: 4.0000 mg | ORAL_TABLET | Freq: Four times a day (QID) | ORAL | Status: DC | PRN

## 2020-05-21 MED ORDER — HYDROCODONE-ACETAMINOPHEN 5-325 MG PO TABS
1.0000 | ORAL_TABLET | ORAL | Status: DC | PRN
  Administered 2020-05-21 – 2020-05-22 (×3): 1 via ORAL
  Filled 2020-05-21 (×3): qty 1

## 2020-05-21 MED ORDER — ACETAMINOPHEN 325 MG PO TABS: 650.0000 mg | ORAL_TABLET | Freq: Four times a day (QID) | ORAL | Status: DC | PRN

## 2020-05-21 MED ORDER — FUROSEMIDE 10 MG/ML IJ SOLN
40.0000 mg | Freq: Once | INTRAMUSCULAR | Status: AC
  Administered 2020-05-21: 40 mg via INTRAVENOUS
  Filled 2020-05-21: qty 4

## 2020-05-21 MED ORDER — SODIUM CHLORIDE 0.9% IV SOLUTION: Freq: Once | INTRAVENOUS | Status: AC

## 2020-05-21 MED ORDER — ACETAMINOPHEN 650 MG RE SUPP: 650.0000 mg | Freq: Four times a day (QID) | RECTAL | Status: DC | PRN

## 2020-05-21 MED ORDER — SODIUM CHLORIDE 0.9% FLUSH
3.0000 mL | Freq: Two times a day (BID) | INTRAVENOUS | Status: DC
  Administered 2020-05-21 – 2020-05-22 (×2): 3 mL via INTRAVENOUS

## 2020-05-21 MED ORDER — ONDANSETRON HCL 4 MG/2ML IJ SOLN: 4.0000 mg | Freq: Four times a day (QID) | INTRAMUSCULAR | Status: DC | PRN

## 2020-05-21 NOTE — Progress Notes (Signed)
Patient assessment reveals bilateral upper lobes with course crackles, bilateral lower lobes  diminished with fine crackles.  Patient has received 4 units PRBC and has 2 more units due, I/O were not documented. RN Christa See NP on call concerning crackles at 1939.

## 2020-05-21 NOTE — Progress Notes (Addendum)
PROGRESS NOTE  Traivon Morrical  GLO:[id number] DOB: Apr 21, 1966 DOA: 05/20/2020 PCP: Patient, No Pcp Per   Brief Narrative: Rahul Malinak is a 54 y.o. male with a history of thalassemia and left shoulder pain for which he chronically takes NSAIDs who presented to the ED with several months of exertional dyspnea and an episode of coffee-ground emesis associated with upper abdominal pain. HR was normal, BP was elevated, and hemoglobin was found to be 2.7g/dl with severely microcytic indices. FOBT positive. No acute intraabdominal findings on CT abdomen/pelvis. He was transferred to [facility] ED for urgent transfusions, GI consulted and planning EGD. 4u PRBCs have been given with rise in hgb to 5.8g/dl. 2 additional units ordered.  Assessment & Plan: Active Problems:   Severe anemia   COVID-19 virus infection   Upper GI bleed   Essential hypertension   Thalassemia   Cardiomegaly  Symptomatic anemia, suspected upper GI bleed w/+FOBT and iron deficiency (ferritin 2, 1% saturation): Severe with likely chronic and sub-acute components including thalassemia and chronic blood loss based on chronicity of symptoms and functional adaptation to such a low hgb.  - Hgb up 2.7 > 5.8 s/p 4u PRBCs. With goal >7g/dl, will order 2 more units and recheck afterward. - Avoid NSAIDs - PPI gtt - GI consulted and will pursue EGD 8/8 +/- colonoscopy afterward depending on findings.  Covid-19 infection: SARS-CoV-2 PCR incidentally positive on screening test 8/6. No CXR infiltrates, respiratory symptoms, or inflammatory marker elevations.  - Observe for now without targeted Tx. Based on BMI, could consider mAb. - Continue airborne, contact precautions for 10 days from positive testing.  Mild troponin elevation, cardiomegaly: Findings suspected to be due to severe anemia. No chest pain or ischemic ECG changes (borderline repol abnormality).  - Echocardiogram ordered. Consider further work up based on this and clinical  trajectory.  Obesity: Estimated body mass index is 34.7 kg/m as calculated from the following:   Height as of this encounter: 5\' 9"  (1.753 m).   Weight as of this encounter: 106.6 kg.  DVT prophylaxis: SCDs Code Status: Full Family Communication: None at bedside Disposition Plan:  Status is: Inpatient  Remains inpatient appropriate because:Hemodynamically unstable, Ongoing diagnostic testing needed not appropriate for outpatient work up and Inpatient level of care appropriate due to severity of illness  Dispo: The patient is from: Home              Anticipated d/c is to: Home              Anticipated d/c date is: 2 days              Patient currently is not medically stable to d/c.  Consultants:    GI  Procedures:   EGD planned 05/22/2020  Antimicrobials:  None  Subjective: Says he's noticed months of progressive dyspnea on exertion, now limited to ambulating <147ft before having to stop to rest. Denies chest pain, leg swelling, orthopnea.   Objective: Vitals:   05/21/20 1430 05/21/20 1500 05/21/20 1530 05/21/20 1600  BP: (!) 174/98 (!) 173/99 (!) 169/98 (!) 180/97  Pulse: 76 77 76 79  Resp: 12 11 13 10   Temp:      TempSrc:      SpO2: 97% 96% 96% 97%  Weight:      Height:        Intake/Output Summary (Last 24 hours) at 05/21/2020 1638 Last data filed at 05/21/2020 0921 Gross per 24 hour  Intake 921 ml  Output --  Net 921  ml   Filed Weights   05/20/20 1508  Weight: 106.6 kg   Gen: 54 y.o. male in no distress  Pulm: Non-labored breathing room air. Clear to auscultation bilaterally.  CV: Regular rate and rhythm. Flow systolic murmur, no rub, or gallop. No JVD, no pedal edema. GI: Abdomen soft, non-tender, non-distended, with normoactive bowel sounds. No organomegaly or masses felt. Ext: Warm, missing distal RUE Skin: No rashes, lesions or ulcers Neuro: Alert and oriented. No focal neurological deficits. Psych: Judgement and insight appear normal. Mood &  affect appropriate.   Data Reviewed: I have personally reviewed following labs and imaging studies  CBC: Recent Labs  Lab 05/20/20 1511 05/20/20 1554 05/21/20 1228  WBC 8.6 9.2 8.0  NEUTROABS  --  7.2 5.7  HGB 2.7* 2.7* 5.8*  HCT 11.8* 12.3* 20.1*  MCV 50.0* 50.4* 64.6*  PLT 356 365 285   Basic Metabolic Panel: Recent Labs  Lab 05/20/20 1511 05/21/20 1228  NA 135 140  K 4.1 4.2  CL 104 109  CO2 20* 21*  GLUCOSE 121* 127*  BUN 33* 26*  CREATININE 0.79 0.91  CALCIUM 8.2* 8.2*  MG  --  2.7*  PHOS  --  3.8   GFR: Estimated Creatinine Clearance: 110.4 mL/min (by C-G formula based on SCr of 0.91 mg/dL). Liver Function Tests: Recent Labs  Lab 05/20/20 1511 05/21/20 1228  AST 16 16  ALT 12 12  ALKPHOS 63 57  BILITOT 0.9 2.5*  PROT 6.3* 6.0*  ALBUMIN 3.2* 3.3*   Recent Labs  Lab 05/20/20 1511  LIPASE 22   Coagulation Profile: Recent Labs  Lab 05/20/20 1510 05/21/20 1228  INR 1.2 1.2   Thyroid Function Tests: Recent Labs    05/21/20 1228  TSH 2.631   Anemia Panel: Recent Labs    05/20/20 2346  VITAMINB12 371  FOLATE 10.9  FERRITIN 2*  TIBC 459*  IRON 6*  RETICCTPCT 4.5*   Urine analysis:    Component Value Date/Time   COLORURINE YELLOW 05/20/2020 1511   APPEARANCEUR CLEAR 05/20/2020 1511   LABSPEC <1.005 (L) 05/20/2020 1511   PHURINE 5.5 05/20/2020 1511   GLUCOSEU NEGATIVE 05/20/2020 1511   HGBUR NEGATIVE 05/20/2020 1511   BILIRUBINUR NEGATIVE 05/20/2020 1511   KETONESUR NEGATIVE 05/20/2020 1511   PROTEINUR NEGATIVE 05/20/2020 1511   NITRITE NEGATIVE 05/20/2020 1511   LEUKOCYTESUR NEGATIVE 05/20/2020 1511   Recent Results (from the past 240 hour(s))  SARS Coronavirus 2 by RT PCR (hospital order, performed in [facility] hospital lab) Nasopharyngeal Nasopharyngeal Swab     Status: Abnormal   Collection Time: 05/20/20  7:36 PM   Specimen: Nasopharyngeal Swab  Result Value Ref Range Status   SARS Coronavirus 2 POSITIVE (A) NEGATIVE  Final    Comment: RESULT CALLED TO, READ BACK BY AND VERIFIED WITH: ADKINS LISA RN [phone] PHILLIPS C (NOTE) SARS-CoV-2 target nucleic acids are DETECTED  SARS-CoV-2 RNA is generally detectable in upper respiratory specimens  during the acute phase of infection.  Positive results are indicative  of the presence of the identified virus, but do not rule out bacterial infection or co-infection with other pathogens not detected by the test.  Clinical correlation with patient history and  other diagnostic information is necessary to determine patient infection status.  The expected result is negative.  Fact Sheet for Patients:   BoilerBrush.com.cy   Fact Sheet for Healthcare Providers:   https://pope.com/    This test is not yet approved or cleared by the [place] FDA and  has been authorized for detection and/or diagnosis of SARS-CoV-2 by FDA under an Emergency Use Authorization (EUA).  This EUA will remain in effect (meaning this  test can be used) for the duration of  the COVID-19 declaration under Section 564(b)(1) of the Act, 21 U.S.C. section 360-bbb-3(b)(1), unless the authorization is terminated or revoked sooner.  Performed at [facility], [address], [place], [place] [address]       Radiology Studies: CT ABDOMEN PELVIS W CONTRAST  Result Date: 05/20/2020 CLINICAL DATA:  Upper abdominal pain with nausea and vomiting. EXAM: CT ABDOMEN AND PELVIS WITH CONTRAST TECHNIQUE: Multidetector CT imaging of the abdomen and pelvis was performed using the standard protocol following bolus administration of intravenous contrast. CONTRAST:  OMNIPAQUE IOHEXOL 300 MG/ML  SOLN COMPARISON:  None. FINDINGS: Lower chest: No acute abnormality. Hepatobiliary: No focal liver abnormality is seen. No gallstones, gallbladder wall thickening, or biliary dilatation. Pancreas: Unremarkable. No pancreatic ductal dilatation or  surrounding inflammatory changes. Spleen: Normal in size without focal abnormality. Adrenals/Urinary Tract: Adrenal glands are unremarkable. Kidneys are normal, without renal calculi, focal lesion, or hydronephrosis. Bladder is unremarkable. Stomach/Bowel: Stomach is within normal limits. The appendix is not clearly identified. No evidence of bowel wall thickening, distention, or inflammatory changes. Vascular/Lymphatic: There is mild calcification of the abdominal aorta and bilateral common iliac arteries without evidence of aneurysmal dilatation. No enlarged abdominal or pelvic lymph nodes. Reproductive: Prostate is unremarkable. Other: No abdominal wall hernia or abnormality. No abdominopelvic ascites. Musculoskeletal: No acute or significant osseous findings. IMPRESSION: 1. No acute intra-abdominal findings. 2. Aortic atherosclerosis. Aortic Atherosclerosis (ICD10-I70.0). Electronically Signed   By: Aram Candela M.D.   On: 05/20/2020 18:07   DG CHEST PORT 1 VIEW  Result Date: 05/21/2020 CLINICAL DATA:  Abdominal pain with nausea and vomiting for 3 months. EXAM: PORTABLE CHEST 1 VIEW COMPARISON:  None. FINDINGS: Cardiac enlargement. No vascular congestion, edema, or consolidation. No pleural effusions. No pneumothorax. Tortuous aorta. Old right rib fractures. IMPRESSION: Cardiac enlargement.  No active pulmonary disease. Electronically Signed   By: Burman Nieves M.D.   On: 05/21/2020 00:14    Scheduled Meds: . sodium chloride   Intravenous Once  . sodium chloride   Intravenous Once  . [START ON 05/24/2020] pantoprazole  40 mg Intravenous Q12H  . sodium chloride flush  3 mL Intravenous Once  . sodium chloride flush  3 mL Intravenous Q12H   Continuous Infusions: . pantoprozole (PROTONIX) infusion 8 mg/hr (05/21/20 0048)     LOS: 1 day   Time spent: 35 minutes.  Tyrone Nine, MD [facility] [email or web address] 05/21/2020, 4:38 PM

## 2020-05-21 NOTE — ED Notes (Signed)
Will collect pending labs at completion of unit of blood.

## 2020-05-21 NOTE — ED Notes (Signed)
Patient given ice chips. 

## 2020-05-21 NOTE — ED Notes (Signed)
Date and time results received: 05/21/20 1:01 PM  (use smartphrase ".now" to insert current time)  Test: Hbg Critical Value: 5.8  Name of Provider Notified: Karleen Hampshire, Primary RN  Orders Received? Or Actions Taken?: Actions Taken: Surveyor, mining

## 2020-05-21 NOTE — ED Notes (Signed)
Pt transferred to hospital bed for comfort.

## 2020-05-21 NOTE — Consult Note (Addendum)
Consultation  Referring Provider: Dr. Jarvis Newcomer     Primary Care Physician:  Patient, No Pcp Per Primary Gastroenterologist: Gentry Fitz       Reason for Consultation: Abdominal pain and coffee-ground emesis         HPI:   Shawn Hamilton is a 54 y.o. male with a past medical history significant for thalassemia, who presented to the ER on 05/20/2020 with nausea, vomiting, abdominal pain and shortness of breath.  We are consulted in regards to coffee-ground emesis.    Today, the patient describes that he has not had any insurance for a while and so could not be seen in the hospital.  Explains that for the past year he has had increasing shortness of breath and weakness telling me that is been harder and harder for him to even walk to his car but he just "did not think about this and did not have time to go to the doctor".  Has had some gas/abdominal pain off and on over the past year, but again, never really thought about it.  Denies seeing any melena but has not really looked.  Yesterday he started vomiting a coffee-ground material and decided it was time to go to the ER.  Does admit to using Aleve four tabs twice a day over the past year and actually added Tylenol 500 mg twice a day as well recently for some shoulder pain.  He knew this was a lot but did not really think about it.    Social history positive for previously selling endoscopic equipment.  He tells me "I know about how they work and I do not need explanation".    Denies fever, chills, weight loss or change in bowel habits.  ER course: Covid positive, hemoglobin down to 2.7 and Hemoccult positive     No previous GI history.  Past Medical History:  Diagnosis Date  . Anemia   . Hypertension   . Pituitary gland disorder [facility])     Past Surgical History:  Procedure Laterality Date  . AMPUTATION FINGER    . SINUS EXPLORATION      Family History  Problem Relation Age of Onset  . Diabetes Neg Hx   . Hypertension Neg Hx     Social  History   Tobacco Use  . Smoking status: Former Games developer  . Smokeless tobacco: Never Used  Substance Use Topics  . Alcohol use: Yes    Comment: occasionally  . Drug use: Never    Prior to Admission medications   Medication Sig Start Date End Date Taking? Authorizing Provider  acetaminophen (TYLENOL) 500 MG tablet Take 1,000 mg by mouth every 6 (six) hours as needed for mild pain.   Yes [provider]  Doxylamine Succinate, Sleep, (SLEEP AID PO) Take 1 tablet by mouth at bedtime as needed (sleep). Generic "Sleep-ease"   Yes [provider]  naproxen sodium (ALEVE) 220 MG tablet Take 880 mg by mouth 2 (two) times daily as needed (pain).   Yes [provider]  Simethicone (GAS-X PO) Take 1 tablet by mouth daily as needed (gas relief).   Yes [provider]    Current Facility-Administered Medications  Medication Dose Route Frequency Provider Last Rate Last Admin  . 0.9 %  sodium chloride infusion (Manually program via Guardrails IV Fluids)   Intravenous Once Therisa Doyne, MD   Held at 05/20/20 2340  . acetaminophen (TYLENOL) tablet 650 mg  650 mg Oral Q6H PRN Therisa Doyne, MD  Or  . acetaminophen (TYLENOL) suppository 650 mg  650 mg Rectal Q6H PRN Doutova, Anastassia, MD      . HYDROcodone-acetaminophen (NORCO/VICODIN) 5-325 MG per tablet 1-2 tablet  1-2 tablet Oral Q4H PRN Doutova, Anastassia, MD      . ondansetron (ZOFRAN) tablet 4 mg  4 mg Oral Q6H PRN Doutova, Anastassia, MD       Or  . ondansetron (ZOFRAN) injection 4 mg  4 mg Intravenous Q6H PRN Doutova, Anastassia, MD      . pantoprazole (PROTONIX) 80 mg in sodium chloride 0.9 % 100 mL (0.8 mg/mL) infusion  8 mg/hr Intravenous Continuous Doutova, Anastassia, MD 10 mL/hr at 05/21/20 0048 8 mg/hr at 05/21/20 0048  . [START ON 05/24/2020] pantoprazole (PROTONIX) injection 40 mg  40 mg Intravenous Q12H Doutova, Anastassia, MD      . sodium chloride flush (NS) 0.9 % injection 3 mL  3  mL Intravenous Once Doutova, Anastassia, MD      . sodium chloride flush (NS) 0.9 % injection 3 mL  3 mL Intravenous Q12H Therisa Doyne, MD       Current Outpatient Medications  Medication Sig Dispense Refill  . acetaminophen (TYLENOL) 500 MG tablet Take 1,000 mg by mouth every 6 (six) hours as needed for mild pain.    . Doxylamine Succinate, Sleep, (SLEEP AID PO) Take 1 tablet by mouth at bedtime as needed (sleep). Generic "Sleep-ease"    . naproxen sodium (ALEVE) 220 MG tablet Take 880 mg by mouth 2 (two) times daily as needed (pain).    . Simethicone (GAS-X PO) Take 1 tablet by mouth daily as needed (gas relief).      Allergies as of 05/20/2020 - Review Complete 05/20/2020  Allergen Reaction Noted  . Alfalfa Itching 05/20/2020  . Sulfa antibiotics Other (See Comments) 05/20/2020     Review of Systems:    Constitutional: No weight loss, fever or chills Skin: No rash  Cardiovascular: No chest pain  Respiratory: +DOE Gastrointestinal: See HPI and otherwise negative Genitourinary: No dysuria Neurological: No headache, dizziness or syncope Musculoskeletal: No new muscle or joint pain Hematologic: No bruising Psychiatric: No history of depression or anxiety    Physical Exam:  Vital signs in last 24 hours: Temp:  [97.8 F (36.6 C)-98.4 F (36.9 C)] 98 F (36.7 C) (08/07 0800) Pulse Rate:  [81-99] 82 (08/07 0810) Resp:  [12-23] 15 (08/07 0810) BP: (133-170)/(42-91) 152/85 (08/07 0810) SpO2:  [97 %-100 %] 97 % (08/07 0810) Weight:  [106.6 kg] 106.6 kg (08/06 1508)   General:   Pleasant Asian male appears to be in NAD, Well developed, Well nourished, alert and cooperative Head:  Normocephalic and atraumatic. Eyes:   PEERL, EOMI. No icterus. Conjunctiva pink. Ears:  Normal auditory acuity. Neck:  Supple Throat: Oral cavity and pharynx without inflammation, swelling or lesion. Poor dentition Lungs: Respirations even and unlabored. Lungs clear to auscultation bilaterally.    No wheezes, crackles, or rhonchi.  Heart: Normal S1, S2. No MRG. Regular rate and rhythm. No peripheral edema, cyanosis or pallor.  Abdomen:  Soft, nondistended, moderate epigastric ttp. No rebound or guarding. Normal bowel sounds. No appreciable masses or hepatomegaly. Rectal:  Not performed.  Msk:  Symmetrical without gross deformities. Peripheral pulses intact.  Extremities:  Without edema, no deformity or joint abnormality.  Neurologic:  Alert and  oriented x4;  grossly normal neurologically.  Skin:   Dry and intact without significant lesions or rashes. Psychiatric: Demonstrates good judgement and reason without abnormal affect  or behaviors.   LAB RESULTS: Recent Labs    05/20/20 1511 05/20/20 1554  WBC 8.6 9.2  HGB 2.7* 2.7*  HCT 11.8* 12.3*  PLT 356 365   BMET Recent Labs    05/20/20 1511  NA 135  K 4.1  CL 104  CO2 20*  GLUCOSE 121*  BUN 33*  CREATININE 0.79  CALCIUM 8.2*   LFT Recent Labs    05/20/20 1511  PROT 6.3*  ALBUMIN 3.2*  AST 16  ALT 12  ALKPHOS 63  BILITOT 0.9   PT/INR Recent Labs    05/20/20 1510  LABPROT 14.9  INR 1.2    STUDIES: CT ABDOMEN PELVIS W CONTRAST  Result Date: 05/20/2020 CLINICAL DATA:  Upper abdominal pain with nausea and vomiting. EXAM: CT ABDOMEN AND PELVIS WITH CONTRAST TECHNIQUE: Multidetector CT imaging of the abdomen and pelvis was performed using the standard protocol following bolus administration of intravenous contrast. CONTRAST:  100mL OMNIPAQUE IOHEXOL 300 MG/ML  SOLN COMPARISON:  None. FINDINGS: Lower chest: No acute abnormality. Hepatobiliary: No focal liver abnormality is seen. No gallstones, gallbladder wall thickening, or biliary dilatation. Pancreas: Unremarkable. No pancreatic ductal dilatation or surrounding inflammatory changes. Spleen: Normal in size without focal abnormality. Adrenals/Urinary Tract: Adrenal glands are unremarkable. Kidneys are normal, without renal calculi, focal lesion, or  hydronephrosis. Bladder is unremarkable. Stomach/Bowel: Stomach is within normal limits. The appendix is not clearly identified. No evidence of bowel wall thickening, distention, or inflammatory changes. Vascular/Lymphatic: There is mild calcification of the abdominal aorta and bilateral common iliac arteries without evidence of aneurysmal dilatation. No enlarged abdominal or pelvic lymph nodes. Reproductive: Prostate is unremarkable. Other: No abdominal wall hernia or abnormality. No abdominopelvic ascites. Musculoskeletal: No acute or significant osseous findings. IMPRESSION: 1. No acute intra-abdominal findings. 2. Aortic atherosclerosis. Aortic Atherosclerosis (ICD10-I70.0). Electronically Signed   By: Aram Candelahaddeus  Houston M.D.   On: 05/20/2020 18:07   DG CHEST PORT 1 VIEW  Result Date: 05/21/2020 CLINICAL DATA:  Abdominal pain with nausea and vomiting for 3 months. EXAM: PORTABLE CHEST 1 VIEW COMPARISON:  None. FINDINGS: Cardiac enlargement. No vascular congestion, edema, or consolidation. No pleural effusions. No pneumothorax. Tortuous aorta. Old right rib fractures. IMPRESSION: Cardiac enlargement.  No active pulmonary disease. Electronically Signed   By: Burman NievesWilliam  Stevens M.D.   On: 05/21/2020 00:14     Impression / Plan:   Impression: 1.  Severe anemia: Hemoglobin 2.7 at time of admission now status post 3 units PRBCs, a fourth has been ordered, reports coffee-ground emesis x1 yesterday and heavy NSAID use over the past year; most likely gastric ulcer/duodenal ulcer versus other upper GI etiology 2.  Upper GI bleed: See above 3.  COVID-19 virus infection: Asymptomatic 4.  Thalassemia 5.  Cardiomegaly  Plan: 1.  Agree with blood transfusions, hemoglobin needs to ideally be above 7 in order for procedures.  Continue to monitor. 2.  Agree with twice daily PPI 3.  Patient will need EGD tomorrow morning with Dr. Christella HartiganJacobs.  Did discuss risks, benefits, limitations and alternatives and patient agrees  to proceed. 4.  Patient can remain n.p.o. until after time of procedure. 5.  Please await any further recommendations from Dr. Christella HartiganJacobs later today.  Thank you for your kind consultation, we will continue to follow.  Violet BaldyJennifer Lynne [facility]  05/21/2020, 8:35 AM  ________________________________________________________________________  [facility] MD note:  I personally spoke with the patient from the doorway because he has COVID, reviewed the data and agree with the assessment and plan  described above.  He has extreme anemia in setting of known thalassemia, very high daily NSAID use for years, recent CGE.  He is COVID +.  He is HD stable, has already received 3 units blood and is getting his 4th started very soon. Continue transfusing to get Hb >7.  IV PPI BID for now.  I am planning EGD tomorrow for further evaluation. He understands that if that fails to adequately explain his severe anemia, IDA then I will likely recommend a colonsocopy this admission as well.   Rob Bunting, MD [facility] Pager [phone]

## 2020-05-21 NOTE — ED Notes (Signed)
Patient given broth. 

## 2020-05-21 NOTE — ED Notes (Signed)
Date and time results received: 05/21/20 5:45 PM  (use smartphrase ".now" to insert current time)  Test: Hgb  Critical Value: 5.7  Name of Provider Notified: Verdon Cummins, RN  Orders Received? Or Actions Taken?: Actions Taken: Verdon Cummins, [place]

## 2020-05-21 NOTE — ED Notes (Signed)
Patient requesting broth with EGD tomorrow. MD paged.

## 2020-05-21 NOTE — Progress Notes (Signed)
Patient is requesting to speak with GI MD to answer questions before EGD is done.

## 2020-05-21 NOTE — ED Notes (Signed)
ED TO INPATIENT HANDOFF REPORT  Name/Age/Gender Shawn Hamilton 54 y.o. male  Code Status    Code Status Orders  (From admission, onward)         Start     Ordered   05/21/20 0640  Full code  Continuous        05/21/20 0639        Code Status History    This patient has a current code status but no historical code status.   Advance Care Planning Activity      Home/SNF/Other Home  Chief Complaint Severe anemia [D64.9]  Level of Care/Admitting Diagnosis ED Disposition    ED Disposition Condition Comment   Admit  Hospital Area: [facility] [[id number]]  Level of Care: Progressive [102]  Admit to Progressive based on following criteria: MULTISYSTEM THREATS such as stable sepsis, metabolic/electrolyte imbalance with or without encephalopathy that is responding to early treatment.  Admit to Progressive based on following criteria: GI, ENDOCRINE disease patients with GI bleeding, acute liver failure or pancreatitis, stable with diabetic ketoacidosis or thyrotoxicosis (hypothyroid) state.  Covid Evaluation: Confirmed COVID Positive  Diagnosis: Severe anemia [[id number]]  Admitting Physician: Therisa Doyne [[id number]]  Attending Physician: Therisa Doyne [[id number]]  Estimated length of stay: past midnight tomorrow  Certification:: I certify this patient will need inpatient services for at least 2 midnights       Medical History Past Medical History:  Diagnosis Date  . Anemia   . Hypertension   . Pituitary gland disorder (HCC)     Allergies Allergies  Allergen Reactions  . Alfalfa Itching  . Sulfa Antibiotics Other (See Comments)    Upset stomach     IV Location/Drains/Wounds Patient Lines/Drains/Airways Status    Active Line/Drains/Airways    Name Placement date Placement time Site Days   Peripheral IV 05/20/20 Left Antecubital 05/20/20  1511  Antecubital  1   Peripheral IV 05/20/20 Left;Posterior Forearm 05/20/20  2338  Forearm  1           Labs/Imaging Results for orders placed or performed during the hospital encounter of 05/20/20 (from the past 48 hour(s))  Protime-INR     Status: None   Collection Time: 05/20/20  3:10 PM  Result Value Ref Range   Prothrombin Time 14.9 11.4 - 15.2 seconds   INR 1.2 0.8 - 1.2    Comment: (NOTE) INR goal varies based on device and disease states. Performed at [facility], [address] [place], [place] [address]   Lipase, blood     Status: None   Collection Time: 05/20/20  3:11 PM  Result Value Ref Range   Lipase 22 11 - 51 U/L    Comment: Performed at [facility], [address] [place], [place] [address]  Comprehensive metabolic panel     Status: Abnormal   Collection Time: 05/20/20  3:11 PM  Result Value Ref Range   Sodium 135 135 - 145 mmol/L   Potassium 4.1 3.5 - 5.1 mmol/L   Chloride 104 98 - 111 mmol/L   CO2 20 (L) 22 - 32 mmol/L   Glucose, Bld 121 (H) 70 - 99 mg/dL    Comment: Glucose reference range applies only to samples taken after fasting for at least 8 hours.   BUN 33 (H) 6 - 20 mg/dL   Creatinine, Ser 4.78 0.61 - 1.24 mg/dL   Calcium 8.2 (L) 8.9 - 10.3 mg/dL   Total Protein 6.3 (L) 6.5 - 8.1  g/dL   Albumin 3.2 (L) 3.5 - 5.0 g/dL   AST 16 15 - 41 U/L   ALT 12 0 - 44 U/L   Alkaline Phosphatase 63 38 - 126 U/L   Total Bilirubin 0.9 0.3 - 1.2 mg/dL   GFR calc non Af Amer >60 >60 mL/min   GFR calc Af Amer >60 >60 mL/min   Anion gap 11 5 - 15    Comment: Performed at [facility], [address] [place], [place] [address]  CBC     Status: Abnormal   Collection Time: 05/20/20  3:11 PM  Result Value Ref Range   WBC 8.6 4.0 - 10.5 K/uL   RBC 2.36 (L) 4.22 - 5.81 MIL/uL   Hemoglobin 2.7 (LL) 13.0 - 17.0 g/dL    Comment: CRITICAL RESULT CALLED TO, READ BACK BY AND VERIFIED WITH: CALLED TO A.HARTLEY AT 1535 BY SROY ON [id number] REPEATED TO VERIFY    HCT 11.8 (L) 39 - 52 %   MCV 50.0 (L) 80.0 - 100.0 fL   MCH 11.4 (L) 26.0 -  34.0 pg   MCHC 22.9 (L) 30.0 - 36.0 g/dL   RDW 95.0 (H) 93.2 - 67.1 %   Platelets 356 150 - 400 K/uL    Comment: PLATELET COUNT CONFIRMED BY SMEAR REPEATED TO VERIFY    nRBC 0.3 (H) 0.0 - 0.2 %    Comment: Performed at [facility], [address] [place], [place] [address]  Urinalysis, Routine w reflex microscopic Urine, Clean Catch     Status: Abnormal   Collection Time: 05/20/20  3:11 PM  Result Value Ref Range   Color, Urine YELLOW YELLOW   APPearance CLEAR CLEAR   Specific Gravity, Urine <1.005 (L) 1.005 - 1.030   pH 5.5 5.0 - 8.0   Glucose, UA NEGATIVE NEGATIVE mg/dL   Hgb urine dipstick NEGATIVE NEGATIVE   Bilirubin Urine NEGATIVE NEGATIVE   Ketones, ur NEGATIVE NEGATIVE mg/dL   Protein, ur NEGATIVE NEGATIVE mg/dL   Nitrite NEGATIVE NEGATIVE   Leukocytes,Ua NEGATIVE NEGATIVE    Comment: Microscopic not done on urines with negative protein, blood, leukocytes, nitrite, or glucose < 500 mg/dL. Performed at [facility], [address] [place], [place] [address]   CBC with Differential     Status: Abnormal   Collection Time: 05/20/20  3:54 PM  Result Value Ref Range   WBC 9.2 4.0 - 10.5 K/uL   RBC 2.44 (L) 4.22 - 5.81 MIL/uL   Hemoglobin 2.7 (LL) 13.0 - 17.0 g/dL    Comment: REPEATED TO VERIFY RESULTS VERIFIED VIA RECOLLECT HARTLEY AMY RN [id number] PHILLIPS C    HCT 12.3 (L) 39 - 52 %   MCV 50.4 (L) 80.0 - 100.0 fL   MCH 11.1 (L) 26.0 - 34.0 pg   MCHC 22.0 (L) 30.0 - 36.0 g/dL   RDW 97.6 (H) 73.4 - 19.3 %   Platelets 365 150 - 400 K/uL    Comment: REPEATED TO VERIFY SPECIMEN CHECKED FOR CLOTS    Neutrophils Relative % 79 %   Neutro Abs 7.2 1.7 - 7.7 K/uL   Lymphocytes Relative 14 %   Lymphs Abs 1.3 0.7 - 4.0 K/uL   Monocytes Relative 6 %   Monocytes Absolute 0.6 0 - 1 K/uL   Eosinophils Relative 1 %   Eosinophils Absolute 0.1 0 - 0 K/uL   WBC Morphology MORPHOLOGY UNREMARKABLE    RBC Morphology TEARDROP CELLS  Comment:  OVALOCYTES Schistocytes present PSYCHOLOGY POLYCHROMASIA PRESENT HARTLEY AMY RN [id number] NOTIFIED OF CORRECTED RBC MORPHOLOGY RESULT     Smear Review Normal platelet morphology    Immature Granulocytes 1 %   Abs Immature Granulocytes 0.06 0.00 - 0.07 K/uL    Comment: Performed at [facility], [address] [place], [place] [address]  Occult blood card to lab, stool Provider will collect     Status: Abnormal   Collection Time: 05/20/20  4:47 PM  Result Value Ref Range   Fecal Occult Bld POSITIVE (A) NEGATIVE    Comment: Performed at [facility], [address] [place], [place] [address]  SARS Coronavirus 2 by RT PCR (hospital order, performed in [facility] hospital lab) Nasopharyngeal Nasopharyngeal Swab     Status: Abnormal   Collection Time: 05/20/20  7:36 PM   Specimen: Nasopharyngeal Swab  Result Value Ref Range   SARS Coronavirus 2 POSITIVE (A) NEGATIVE    Comment: RESULT CALLED TO, READ BACK BY AND VERIFIED WITH: ADKINS LISA RN [id number] PHILLIPS C (NOTE) SARS-CoV-2 target nucleic acids are DETECTED  SARS-CoV-2 RNA is generally detectable in upper respiratory specimens  during the acute phase of infection.  Positive results are indicative  of the presence of the identified virus, but do not rule out bacterial infection or co-infection with other pathogens not detected by the test.  Clinical correlation with patient history and  other diagnostic information is necessary to determine patient infection status.  The expected result is negative.  Fact Sheet for Patients:   BoilerBrush.com.cy   Fact Sheet for Healthcare Providers:   https://pope.com/    This test is not yet approved or cleared by the [place] FDA and  has been authorized for detection and/or diagnosis of SARS-CoV-2 by FDA under an Emergency Use Authorization (EUA).  This EUA will remain in effect (meaning this  test can be  used) for the duration of  the COVID-19 declaration under Section 564(b)(1) of the Act, 21 U.S.C. section 360-bbb-3(b)(1), unless the authorization is terminated or revoked sooner.  Performed at [facility], [address] [place], [place] [address]   Type and screen [facility]     Status: None (Preliminary result)   Collection Time: 05/20/20  9:25 PM  Result Value Ref Range   ABO/RH(D) A POS    Antibody Screen NEG    Sample Expiration      05/23/2020,2359 Performed at [facility], [address], [place], [place] [address]    Unit Number [id number]    Blood Component Type RED CELLS,LR    Unit division 00    Status of Unit ISSUED    Transfusion Status OK TO TRANSFUSE    Crossmatch Result Compatible    Unit Number [id number]    Blood Component Type RBC LR PHER1    Unit division 00    Status of Unit ISSUED    Transfusion Status OK TO TRANSFUSE    Crossmatch Result Compatible    Unit Number [id number]    Blood Component Type RED CELLS,LR    Unit division 00    Status of Unit ISSUED    Transfusion Status OK TO TRANSFUSE    Crossmatch Result Compatible    Unit Number [id number]    Blood Component Type RED CELLS,LR    Unit division 00    Status of Unit ISSUED    Transfusion Status OK TO TRANSFUSE  Crossmatch Result Compatible    Unit Number [id number]    Blood Component Type RED CELLS,LR    Unit division 00    Status of Unit ALLOCATED    Transfusion Status OK TO TRANSFUSE    Crossmatch Result Compatible    Unit Number [id number]    Blood Component Type RED CELLS,LR    Unit division 00    Status of Unit ALLOCATED    Transfusion Status OK TO TRANSFUSE    Crossmatch Result Compatible   ABO/Rh     Status: None   Collection Time: 05/20/20 10:25 PM  Result Value Ref Range   ABO/RH(D)      A POS Performed at [facility], [address] [place], [place] [address]   Troponin I  (High Sensitivity)     Status: Abnormal   Collection Time: 05/20/20 11:45 PM  Result Value Ref Range   Troponin I (High Sensitivity) 24 (H) <18 ng/L    Comment: (NOTE) Elevated high sensitivity troponin I (hsTnI) values and significant  changes across serial measurements may suggest ACS but many other  chronic and acute conditions are known to elevate hsTnI results.  Refer to the "Links" section for chest pain algorithms and additional  guidance. Performed at [facility], [address] [place], [place] [address]   Vitamin B12     Status: None   Collection Time: 05/20/20 11:46 PM  Result Value Ref Range   Vitamin B-12 371 180 - 914 pg/mL    Comment: (NOTE) This assay is not validated for testing neonatal or myeloproliferative syndrome specimens for Vitamin B12 levels. Performed at [facility], [address] [place], [place] [address]   Folate     Status: None   Collection Time: 05/20/20 11:46 PM  Result Value Ref Range   Folate 10.9 >5.9 ng/mL    Comment: Performed at [facility], [address] [place], [place] [address]  Iron and TIBC     Status: Abnormal   Collection Time: 05/20/20 11:46 PM  Result Value Ref Range   Iron 6 (L) 45 - 182 ug/dL   TIBC 564 (H) 332 - 951 ug/dL   Saturation Ratios 1 (L) 17.9 - 39.5 %   UIBC 453 ug/dL    Comment: Performed at [facility], [address], [place], [place] [address]  Ferritin     Status: Abnormal   Collection Time: 05/20/20 11:46 PM  Result Value Ref Range   Ferritin 2 (L) 24 - 336 ng/mL    Comment: Performed at [facility], [address] [place], [place] [address]  Reticulocytes     Status: Abnormal   Collection Time: 05/20/20 11:46 PM  Result Value Ref Range   Retic Ct Pct 4.5 (H) 0.4 - 3.1 %   RBC. 2.16 (L) 4.22 - 5.81 MIL/uL    Comment: checked for clot   Retic Count, Absolute 97.0 19.0 - 186.0 K/uL   Immature Retic  Fract 39.7 (H) 2.3 - 15.9 %    Comment: Performed at [facility], [address], [place], [place] [address]  HIV Antibody (routine testing w rflx)     Status: None   Collection Time: 05/20/20 11:46 PM  Result Value Ref Range   HIV Screen 4th Generation wRfx Non Reactive Non Reactive    Comment: Performed at [facility], [address] [place], [place] [address]  C-reactive protein  Status: None   Collection Time: 05/20/20 11:46 PM  Result Value Ref Range   CRP 0.5 <1.0 mg/dL    Comment: Performed at [facility], [address], [place], [place] [address]  D-dimer, quantitative (not at [facility])     Status: None   Collection Time: 05/20/20 11:46 PM  Result Value Ref Range   D-Dimer, Quant 0.28 0.00 - 0.50 ug/mL-FEU    Comment: (NOTE) At the manufacturer cut-off of 0.50 ug/mL FEU, this assay has been documented to exclude PE with a sensitivity and negative predictive value of 97 to 99%.  At this time, this assay has not been approved by the FDA to exclude DVT/VTE. Results should be correlated with clinical presentation. Performed at [facility], [address] [place], [place] [address]   Fibrinogen     Status: None   Collection Time: 05/20/20 11:46 PM  Result Value Ref Range   Fibrinogen 293 210 - 475 mg/dL    Comment: Performed at [facility], [address], [place], [place] [address]  Lactate dehydrogenase     Status: None   Collection Time: 05/20/20 11:46 PM  Result Value Ref Range   LDH 118 98 - 192 U/L    Comment: Performed at [facility], [address] [place], [place] [address]  Procalcitonin     Status: None   Collection Time: 05/20/20 11:46 PM  Result Value Ref Range   Procalcitonin <0.10 ng/mL    Comment:        Interpretation: PCT (Procalcitonin) <= 0.5 ng/mL: Systemic infection (sepsis) is not likely. Local bacterial infection is possible. (NOTE)        Sepsis PCT Algorithm           Lower Respiratory Tract                                      Infection PCT Algorithm    ----------------------------     ----------------------------         PCT < 0.25 ng/mL                PCT < 0.10 ng/mL          Strongly encourage             Strongly discourage   discontinuation of antibiotics    initiation of antibiotics    ----------------------------     -----------------------------       PCT 0.25 - 0.50 ng/mL            PCT 0.10 - 0.25 ng/mL               OR       >80% decrease in PCT            Discourage initiation of                                            antibiotics      Encourage discontinuation           of antibiotics    ----------------------------     -----------------------------         PCT >= 0.50 ng/mL              PCT 0.26 - 0.50 ng/mL  AND        <80% decrease in PCT             Encourage initiation of                                             antibiotics       Encourage continuation           of antibiotics    ----------------------------     -----------------------------        PCT >= 0.50 ng/mL                  PCT > 0.50 ng/mL               AND         increase in PCT                  Strongly encourage                                      initiation of antibiotics    Strongly encourage escalation           of antibiotics                                     -----------------------------                                           PCT <= 0.25 ng/mL                                                 OR                                        > 80% decrease in PCT                                      Discontinue / Do not initiate                                             antibiotics  Performed at [facility], [address], [place], [place] [address]   Troponin I (High Sensitivity)     Status: Abnormal   Collection Time: 05/20/20 11:46 PM  Result Value Ref Range   Troponin I (High  Sensitivity) 26 (H) <18 ng/L    Comment: (NOTE) Elevated high sensitivity troponin I (hsTnI) values and significant  changes across serial measurements may suggest ACS but many other  chronic and acute conditions are known to elevate hsTnI results.  Refer to the "Links" section for chest pain algorithms and additional  guidance. Performed at  [facility], [address] [place], [place] [address]   Prepare RBC (crossmatch)     Status: None   Collection Time: 05/20/20 11:46 PM  Result Value Ref Range   Order Confirmation      ORDER PROCESSED BY BLOOD BANK Performed at [facility], [address] [place], [place] [address]   Magnesium     Status: Abnormal   Collection Time: 05/21/20 12:28 PM  Result Value Ref Range   Magnesium 2.7 (H) 1.7 - 2.4 mg/dL    Comment: Performed at [facility], [address] [place], [place] [address]  Phosphorus     Status: None   Collection Time: 05/21/20 12:28 PM  Result Value Ref Range   Phosphorus 3.8 2.5 - 4.6 mg/dL    Comment: Performed at [facility], [address] [place], [place] [address]  CBC WITH DIFFERENTIAL     Status: Abnormal   Collection Time: 05/21/20 12:28 PM  Result Value Ref Range   WBC 8.0 4.0 - 10.5 K/uL   RBC 3.11 (L) 4.22 - 5.81 MIL/uL   Hemoglobin 5.8 (LL) 13.0 - 17.0 g/dL    Comment: Reticulocyte Hemoglobin testing may be clinically indicated, consider ordering this additional test [id number] THIS CRITICAL RESULT HAS VERIFIED AND BEEN CALLED TO [facility],S BY KENNEDY JACKSON ON 08 07 2021 AT 1308, AND HAS BEEN READ BACK. CRITICAL RESULTS VERIFIED    HCT 20.1 (L) 39 - 52 %   MCV 64.6 (L) 80.0 - 100.0 fL   MCH 18.6 (L) 26.0 - 34.0 pg   MCHC 28.9 (L) 30.0 - 36.0 g/dL   RDW Not Measured 32.411.5 - 15.5 %   Platelets 285 150 - 400 K/uL   nRBC 0.5 (H) 0.0 - 0.2 %   Neutrophils Relative % 71 %   Neutro Abs 5.7 1.7 - 7.7 K/uL   Lymphocytes Relative 17  %   Lymphs Abs 1.4 0.7 - 4.0 K/uL   Monocytes Relative 8 %   Monocytes Absolute 0.6 0 - 1 K/uL   Eosinophils Relative 2 %   Eosinophils Absolute 0.2 0 - 0 K/uL   Basophils Relative 1 %   Basophils Absolute 0.1 0 - 0 K/uL   Immature Granulocytes 1 %   Abs Immature Granulocytes 0.05 0.00 - 0.07 K/uL   Tear Drop Cells PRESENT    Polychromasia PRESENT    Target Cells PRESENT     Comment: Performed at [facility], [address] [place], [place] [address]  TSH     Status: None   Collection Time: 05/21/20 12:28 PM  Result Value Ref Range   TSH 2.631 0.350 - 4.500 uIU/mL    Comment: Performed by a 3rd Generation assay with a functional sensitivity of <=0.01 uIU/mL. Performed at [facility], [address] [place], [place] [address]   Comprehensive metabolic panel     Status: Abnormal   Collection Time: 05/21/20 12:28 PM  Result Value Ref Range   Sodium 140 135 - 145 mmol/L   Potassium 4.2 3.5 - 5.1 mmol/L   Chloride 109 98 - 111 mmol/L   CO2 21 (L) 22 - 32 mmol/L   Glucose, Bld 127 (H) 70 - 99 mg/dL    Comment: Glucose reference range applies only to samples taken after fasting for at least 8 hours.   BUN 26 (H) 6 - 20 mg/dL   Creatinine, Ser 6.440.91 0.61 - 1.24 mg/dL   Calcium  8.2 (L) 8.9 - 10.3 mg/dL   Total Protein 6.0 (L) 6.5 - 8.1 g/dL   Albumin 3.3 (L) 3.5 - 5.0 g/dL   AST 16 15 - 41 U/L   ALT 12 0 - 44 U/L   Alkaline Phosphatase 57 38 - 126 U/L   Total Bilirubin 2.5 (H) 0.3 - 1.2 mg/dL   GFR calc non Af Amer >60 >60 mL/min   GFR calc Af Amer >60 >60 mL/min   Anion gap 10 5 - 15    Comment: Performed at [facility], [address] [place], [place] [address]  Protime-INR     Status: None   Collection Time: 05/21/20 12:28 PM  Result Value Ref Range   Prothrombin Time 14.9 11.4 - 15.2 seconds   INR 1.2 0.8 - 1.2    Comment: (NOTE) INR goal varies based on device and disease states. Performed at [facility], [address] [place], [place] [address]   Prepare RBC (crossmatch)     Status: None   Collection Time: 05/21/20  5:00 PM  Result Value Ref Range   Order Confirmation      ORDER PROCESSED BY BLOOD BANK Performed at [facility], [address], [place], [place] [address]    CT ABDOMEN PELVIS W CONTRAST  Result Date: 05/20/2020 CLINICAL DATA:  Upper abdominal pain with nausea and vomiting. EXAM: CT ABDOMEN AND PELVIS WITH CONTRAST TECHNIQUE: Multidetector CT imaging of the abdomen and pelvis was performed using the standard protocol following bolus administration of intravenous contrast. CONTRAST:  OMNIPAQUE IOHEXOL 300 MG/ML  SOLN COMPARISON:  None. FINDINGS: Lower chest: No acute abnormality. Hepatobiliary: No focal liver abnormality is seen. No gallstones, gallbladder wall thickening, or biliary dilatation. Pancreas: Unremarkable. No pancreatic ductal dilatation or surrounding inflammatory changes. Spleen: Normal in size without focal abnormality. Adrenals/Urinary Tract: Adrenal glands are unremarkable. Kidneys are normal, without renal calculi, focal lesion, or hydronephrosis. Bladder is unremarkable. Stomach/Bowel: Stomach is within normal limits. The appendix is not clearly identified. No evidence of bowel wall thickening, distention, or inflammatory changes. Vascular/Lymphatic: There is mild calcification of the abdominal aorta and bilateral common iliac arteries without evidence of aneurysmal dilatation. No enlarged abdominal or pelvic lymph nodes. Reproductive: Prostate is unremarkable. Other: No abdominal wall hernia or abnormality. No abdominopelvic ascites. Musculoskeletal: No acute or significant osseous findings. IMPRESSION: 1. No acute intra-abdominal findings. 2. Aortic atherosclerosis. Aortic Atherosclerosis (ICD10-I70.0). Electronically Signed   By: Aram Candela M.D.   On: 05/20/2020 18:07   DG CHEST PORT 1 VIEW  Result Date:  05/21/2020 CLINICAL DATA:  Abdominal pain with nausea and vomiting for 3 months. EXAM: PORTABLE CHEST 1 VIEW COMPARISON:  None. FINDINGS: Cardiac enlargement. No vascular congestion, edema, or consolidation. No pleural effusions. No pneumothorax. Tortuous aorta. Old right rib fractures. IMPRESSION: Cardiac enlargement.  No active pulmonary disease. Electronically Signed   By: Burman Nieves M.D.   On: 05/21/2020 00:14    Pending Labs Unresulted Labs (From admission, onward) Comment          Start     Ordered   05/21/20 1000  CBC  Now then every 6 hours,   STAT     Comments: Call MD if Hg <8, plt <100    05/21/20 0639          Vitals/Pain Today's Vitals   05/21/20 1500 05/21/20 1530 05/21/20 1600 05/21/20 1641  BP: (!) 173/99 (!) 169/98 (!) 180/97 (!) 170/96  Pulse: 77  76 79 74  Resp: 11 13 10 13   Temp:      TempSrc:      SpO2: 96% 96% 97% 96%  Weight:      Height:      PainSc:        Isolation Precautions Airborne and Contact precautions  Medications Medications  sodium chloride flush (NS) 0.9 % injection 3 mL (3 mLs Intravenous Not Given 05/20/20 1511)  pantoprazole (PROTONIX) 80 mg in sodium chloride 0.9 % 100 mL (0.8 mg/mL) infusion (8 mg/hr Intravenous New Bag/Given 05/21/20 0048)  pantoprazole (PROTONIX) injection 40 mg (has no administration in time range)  0.9 %  sodium chloride infusion (Manually program via Guardrails IV Fluids) (0 mLs Intravenous Hold 05/20/20 2340)  sodium chloride flush (NS) 0.9 % injection 3 mL (3 mLs Intravenous Given 05/21/20 0907)  acetaminophen (TYLENOL) tablet 650 mg (has no administration in time range)    Or  acetaminophen (TYLENOL) suppository 650 mg (has no administration in time range)  HYDROcodone-acetaminophen (NORCO/VICODIN) 5-325 MG per tablet 1-2 tablet (1 tablet Oral Given 05/21/20 1148)  ondansetron (ZOFRAN) tablet 4 mg (has no administration in time range)    Or  ondansetron (ZOFRAN) injection 4 mg (has no administration in time  range)  0.9 %  sodium chloride infusion (Manually program via Guardrails IV Fluids) (has no administration in time range)  morphine 4 MG/ML injection 4 mg (4 mg Intravenous Given 05/20/20 2036)  ondansetron (ZOFRAN) injection 4 mg (4 mg Intravenous Given 05/20/20 2036)  iohexol (OMNIPAQUE) 300 MG/ML solution 100 mL (100 mLs Intravenous Contrast Given 05/20/20 1734)  pantoprazole (PROTONIX) 80 mg in sodium chloride 0.9 % 100 mL IVPB (0 mg Intravenous Stopped 05/20/20 1847)  pantoprazole (PROTONIX) 40 MG injection (80 mg  Given 05/20/20 1803)  acetaminophen (TYLENOL) tablet 650 mg (650 mg Oral Given 05/21/20 0030)  diphenhydrAMINE (BENADRYL) capsule 25 mg (25 mg Oral Given 05/21/20 0030)    Mobility walks

## 2020-05-21 NOTE — Telephone Encounter (Signed)
Opened in error

## 2020-05-22 ENCOUNTER — Inpatient Hospital Stay (HOSPITAL_COMMUNITY): Payer: Self-pay

## 2020-05-22 ENCOUNTER — Encounter (HOSPITAL_COMMUNITY)
Admission: EM | Disposition: A | Discharge status: Home / Self Care | Payer: Self-pay | Source: Home / Self Care | Attending: Internal Medicine

## 2020-05-22 DIAGNOSIS — I340 Nonrheumatic mitral (valve) insufficiency: Secondary | ICD-10-CM

## 2020-05-22 DIAGNOSIS — I351 Nonrheumatic aortic (valve) insufficiency: Secondary | ICD-10-CM

## 2020-05-22 DIAGNOSIS — I361 Nonrheumatic tricuspid (valve) insufficiency: Secondary | ICD-10-CM

## 2020-05-22 LAB — CBC
HCT: 25.3 % — ABNORMAL LOW (ref 39.0–52.0)
Hemoglobin: 7.7 g/dL — ABNORMAL LOW (ref 13.0–17.0)
MCH: 20.9 pg — ABNORMAL LOW (ref 26.0–34.0)
MCHC: 30.4 g/dL (ref 30.0–36.0)
MCV: 68.8 fL — ABNORMAL LOW (ref 80.0–100.0)
Platelets: 254 10*3/uL (ref 150–400)
RBC: 3.68 MIL/uL — ABNORMAL LOW (ref 4.22–5.81)
WBC: 8.4 10*3/uL (ref 4.0–10.5)
nRBC: 0.4 % — ABNORMAL HIGH (ref 0.0–0.2)

## 2020-05-22 LAB — ECHOCARDIOGRAM COMPLETE
Area-P 1/2: 3.34 cm2
Height: 69 in
P 1/2 time: 351 msec
Weight: 3760 oz

## 2020-05-22 SURGERY — ESOPHAGOGASTRODUODENOSCOPY (EGD) WITH PROPOFOL
Anesthesia: Monitor Anesthesia Care

## 2020-05-22 MED ORDER — HYDRALAZINE HCL 25 MG PO TABS
25.0000 mg | ORAL_TABLET | Freq: Four times a day (QID) | ORAL | Status: DC | PRN
  Administered 2020-05-22: 25 mg via ORAL
  Filled 2020-05-22: qty 1

## 2020-05-22 NOTE — Progress Notes (Signed)
Patient is requesting to speak with GI MD prior to EGD or signing of consent. States he has questions regarding his situation and the procedure. Patient has been NPO since 0000 05/22/20, patient uncomfortable signing consent form prior to speaking with physician.

## 2020-05-22 NOTE — Progress Notes (Signed)
His floor RN just asked that I speak with him again as he had several more questions and is reconsidering consenting to testing.  I called into his room.  Answered all of his questions which had really been addressed yesterday and also this AM however he is more agreeable now to going ahead with EGD.  He understands that if no obvious cause of his severe anemia is discovered then he will probably be recommended to have a colonoscopy during this admission as well.  I will make him NPO after MN.  [facility] will see him relatively early tomorrow AM to check if he still consents and if so then we will look toward EGD tomorrow afternoon.

## 2020-05-22 NOTE — Progress Notes (Signed)
[facility] Progress Note    Since last GI note: He has had no overt bleeding. Has been transfused a total of 6 units of blood, admitting Hb was 2.7.  Awaiting AM CBC but I am told it was 'not critical' at least.  I donned appropriate COVID protection and discussed his case in his room this morning.  He declines any testing.  Objective: Vital signs in last 24 hours: Temp:  [98 F (36.7 C)-98.9 F (37.2 C)] 98 F (36.7 C) (08/08 0511) Pulse Rate:  [74-88] 80 (08/08 0511) Resp:  [10-24] 12 (08/08 0511) BP: (152-188)/(70-105) 174/98 (08/08 0511) SpO2:  [95 %-100 %] 98 % (08/08 0511) Last BM Date: 05/19/20 General: alert and oriented times 3 Heart: regular rate and rythm Abdomen: soft, non-tender, non-distended, normal bowel sounds  Lab Results: Recent Labs    05/20/20 1554 05/21/20 1228 05/21/20 1600  WBC 9.2 8.0 8.0  HGB 2.7* 5.8* 5.7*  PLT 365 285 276  MCV 50.4* 64.6* 64.9*   Recent Labs    05/20/20 1511 05/21/20 1228  NA 135 140  K 4.1 4.2  CL 104 109  CO2 20* 21*  GLUCOSE 121* 127*  BUN 33* 26*  CREATININE 0.79 0.91  CALCIUM 8.2* 8.2*   Recent Labs    05/20/20 1511 05/21/20 1228  PROT 6.3* 6.0*  ALBUMIN 3.2* 3.3*  AST 16 16  ALT 12 12  ALKPHOS 63 57  BILITOT 0.9 2.5*   Recent Labs    05/20/20 1510 05/21/20 1228  INR 1.2 1.2     Studies/Results: CT ABDOMEN PELVIS W CONTRAST  Result Date: 05/20/2020 CLINICAL DATA:  Upper abdominal pain with nausea and vomiting. EXAM: CT ABDOMEN AND PELVIS WITH CONTRAST TECHNIQUE: Multidetector CT imaging of the abdomen and pelvis was performed using the standard protocol following bolus administration of intravenous contrast. CONTRAST:  OMNIPAQUE IOHEXOL 300 MG/ML  SOLN COMPARISON:  None. FINDINGS: Lower chest: No acute abnormality. Hepatobiliary: No focal liver abnormality is seen. No gallstones, gallbladder wall thickening, or biliary dilatation. Pancreas: Unremarkable. No pancreatic ductal  dilatation or surrounding inflammatory changes. Spleen: Normal in size without focal abnormality. Adrenals/Urinary Tract: Adrenal glands are unremarkable. Kidneys are normal, without renal calculi, focal lesion, or hydronephrosis. Bladder is unremarkable. Stomach/Bowel: Stomach is within normal limits. The appendix is not clearly identified. No evidence of bowel wall thickening, distention, or inflammatory changes. Vascular/Lymphatic: There is mild calcification of the abdominal aorta and bilateral common iliac arteries without evidence of aneurysmal dilatation. No enlarged abdominal or pelvic lymph nodes. Reproductive: Prostate is unremarkable. Other: No abdominal wall hernia or abnormality. No abdominopelvic ascites. Musculoskeletal: No acute or significant osseous findings. IMPRESSION: 1. No acute intra-abdominal findings. 2. Aortic atherosclerosis. Aortic Atherosclerosis (ICD10-I70.0). Electronically Signed   By: Aram Candela M.D.   On: 05/20/2020 18:07   DG CHEST PORT 1 VIEW  Result Date: 05/21/2020 CLINICAL DATA:  Abdominal pain with nausea and vomiting for 3 months. EXAM: PORTABLE CHEST 1 VIEW COMPARISON:  None. FINDINGS: Cardiac enlargement. No vascular congestion, edema, or consolidation. No pleural effusions. No pneumothorax. Tortuous aorta. Old right rib fractures. IMPRESSION: Cardiac enlargement.  No active pulmonary disease. Electronically Signed   By: Burman Nieves M.D.   On: 05/21/2020 00:14    Medications: Scheduled Meds: . [START ON 05/24/2020] pantoprazole  40 mg Intravenous Q12H  . sodium chloride flush  3 mL Intravenous Once  . sodium chloride flush  3 mL Intravenous Q12H   Continuous Infusions: . pantoprozole (PROTONIX) infusion 8 mg/hr (  05/22/20 0155)   PRN Meds:.acetaminophen **OR** acetaminophen, HYDROcodone-acetaminophen, ondansetron **OR** ondansetron (ZOFRAN) IV   Assessment/Plan: 54 y.o. male admitted with severe anemia, covid +, known thalasemia.  Extremely high  daily NSAID use.  We discussed his case.  I recommended EGD for further evaluation and I explained the risks and benefits to him.  He declines to consent for the procedure.  I am cancelling his case for now and am signing off for now.  I recommend that he absolutely refrain from any further NSAID use, that he be on a PPI BID for at least the next 2 months.  I think testing blood for evidence of H. Pylori is reasonable and if this is positive he should be treated with appropriate antibiotics.  Please let me know if he changes his mind.  I am happy to continue caring for him however it would be quite difficult to rearrange EGD testing for today.  Please call or page with any further questions or concerns.   Rachael Fee, MD  05/22/2020, 7:15 AM [facility] Pager [phone]

## 2020-05-22 NOTE — Progress Notes (Signed)
  Echocardiogram 2D Echocardiogram has been performed.  Shawn Hamilton 05/22/2020, 10:31 AM

## 2020-05-22 NOTE — Progress Notes (Signed)
PROGRESS NOTE  Shawn Hamilton  LYY:[id number] DOB: 1966/01/12 DOA: 05/20/2020 PCP: Patient, No Pcp Per   Brief Narrative: Shawn Hamilton is a 54 y.o. male with a history of thalassemia and left shoulder pain for which he chronically takes NSAIDs who presented to the ED with several months of exertional dyspnea and an episode of coffee-ground emesis associated with upper abdominal pain. HR was normal, BP was elevated, and hemoglobin was found to be 2.7g/dl with severely microcytic indices. FOBT positive. No acute intraabdominal findings on CT abdomen/pelvis. He was transferred to [facility] ED for urgent transfusions, GI consulted and planning EGD. 4u PRBCs have been given with rise in hgb to 5.8g/dl. 2 additional units ordered.  Assessment & Plan: Active Problems:   Severe anemia   COVID-19 virus infection   Upper GI bleed   Essential hypertension   Thalassemia   Cardiomegaly  Acute symptomatic anemia - multifactorial: suspected upper GI bleed w/+FOBT and iron deficiency (ferritin 2, 1% saturation):  - Severe with acute symptoms - likely chronic and sub-acute components including thalassemia and chronic blood loss based on chronicity of symptoms and functional adaptation to such a low hgb.  - Hgb up 2.7 at presentation - s/p 6u transfused since admission: trending upwards appropriately: CBC Latest Ref Rng & Units 05/22/2020 05/21/2020 05/21/2020  WBC 4.0 - 10.5 K/uL 8.4 8.0 8.0  Hemoglobin 13.0 - 17.0 g/dL 7.7(L) 5.7(LL) 5.8(LL)  Hematocrit 39 - 52 % 25.3(L) 20.0(L) 20.1(L)  Platelets 150 - 400 K/uL 254 276 285  - Avoid NSAIDs - PPI gtt - GI consulted previously planned for upper and lower endoscopy, patient declined.  Requesting further information and discussion with GI as he was "unsure why they were doing endoscopy" and states he wants more information prior to consenting to procedure  Covid-19 infection: SARS-CoV-2 PCR incidentally positive on screening test 8/6.  - No CXR infiltrates, respiratory symptoms,  or inflammatory marker elevations.  - Observe for now - no indication for targeted treatment - High risk given BMI - Consider mAb in the outpatient setting - Continue airborne, contact precautions for 10 days from positive testing - Close outpatient follow up - may be candidate for infusion center if symptoms arise Recent Labs    05/20/20 2346  DDIMER 0.28  FERRITIN 2*  LDH 118  CRP 0.5   Lab Results  Component Value Date   SARSCOV2NAA POSITIVE (A) 05/20/2020   Mild troponin elevation, cardiomegaly:  - In the setting of severe anemia.  - Asymptomatic: No chest pain or ischemic ECG changes (borderline repol abnormality).  - Echocardiogram shows grade 2 diastolic dysfunction with preserved EF 60-65%.  Obesity: Estimated body mass index is 34.7 kg/m as calculated from the following:   Height as of this encounter: 5\' 9"  (1.753 m).   Weight as of this encounter: 106.6 kg.  DVT prophylaxis: SCDs Code Status: Full Family Communication: None available Disposition Plan:  Status is: Inpatient  Remains inpatient appropriate because:Hemodynamically unstable, Ongoing diagnostic testing needed not appropriate for outpatient work up and Inpatient level of care appropriate due to severity of illness  Dispo: The patient is from: Home              Anticipated d/c is to: Home              Anticipated d/c date is: 2 days              Patient currently is not medically stable to d/c.  Consultants:   [facility]  Procedures:   EGD planned 05/22/2020  Antimicrobials:  None  Subjective: No acute issues or events overnight, denies nausea, vomiting, diarrhea, constipation, chest pain, headache, fevers, chills.  Denies any further episodes of emesis coffee-ground or otherwise, states his stool remains dark - questionably black.  Objective: Vitals:   05/21/20 2329 05/22/20 0005 05/22/20 0200 05/22/20 0511  BP: (!) 181/99 (!) 167/90 (!) 177/97 (!) 174/98  Pulse: 76 74 77 80  Resp: 16 16  14 12   Temp: 98.2 F (36.8 C) 98.7 F (37.1 C) 98.7 F (37.1 C) 98 F (36.7 C)  TempSrc: Oral Oral Oral Oral  SpO2: 99% 98% 98% 98%  Weight:      Height:        Intake/Output Summary (Last 24 hours) at 05/22/2020 0744 Last data filed at 05/22/2020 0300 Gross per 24 hour  Intake 2357.24 ml  Output 3200 ml  Net -842.76 ml   Filed Weights   05/20/20 1508  Weight: 106.6 kg   Gen: 54 y.o. male in no distress  Pulm: Non-labored breathing room air. Clear to auscultation bilaterally.  CV: Regular rate and rhythm. Flow systolic murmur, no rub, or gallop. No JVD, no pedal edema. GI: Abdomen soft, non-tender, non-distended, with normoactive bowel sounds. No organomegaly or masses felt. Ext: Warm, missing distal RUE Skin: No rashes, lesions or ulcers Neuro: Alert and oriented. No focal neurological deficits. Psych: Judgement and insight appear normal. Mood & affect appropriate.   Data Reviewed: I have personally reviewed following labs and imaging studies  CBC: Recent Labs  Lab 05/20/20 1511 05/20/20 1554 05/21/20 1228 05/21/20 1600 05/22/20 0642  WBC 8.6 9.2 8.0 8.0 8.4  NEUTROABS  --  7.2 5.7  --   --   HGB 2.7* 2.7* 5.8* 5.7* 7.7*  HCT 11.8* 12.3* 20.1* 20.0* 25.3*  MCV 50.0* 50.4* 64.6* 64.9* 68.8*  PLT 356 365 285 276 254   Basic Metabolic Panel: Recent Labs  Lab 05/20/20 1511 05/21/20 1228  NA 135 140  K 4.1 4.2  CL 104 109  CO2 20* 21*  GLUCOSE 121* 127*  BUN 33* 26*  CREATININE 0.79 0.91  CALCIUM 8.2* 8.2*  MG  --  2.7*  PHOS  --  3.8   GFR: Estimated Creatinine Clearance: 110.4 mL/min (by C-G formula based on SCr of 0.91 mg/dL). Liver Function Tests: Recent Labs  Lab 05/20/20 1511 05/21/20 1228  AST 16 16  ALT 12 12  ALKPHOS 63 57  BILITOT 0.9 2.5*  PROT 6.3* 6.0*  ALBUMIN 3.2* 3.3*   Recent Labs  Lab 05/20/20 1511  LIPASE 22   Coagulation Profile: Recent Labs  Lab 05/20/20 1510 05/21/20 1228  INR 1.2 1.2   Thyroid Function  Tests: Recent Labs    05/21/20 1228  TSH 2.631   Anemia Panel: Recent Labs    05/20/20 2346  VITAMINB12 371  FOLATE 10.9  FERRITIN 2*  TIBC 459*  IRON 6*  RETICCTPCT 4.5*   Urine analysis:    Component Value Date/Time   COLORURINE YELLOW 05/20/2020 1511   APPEARANCEUR CLEAR 05/20/2020 1511   LABSPEC <1.005 (L) 05/20/2020 1511   PHURINE 5.5 05/20/2020 1511   GLUCOSEU NEGATIVE 05/20/2020 1511   HGBUR NEGATIVE 05/20/2020 1511   BILIRUBINUR NEGATIVE 05/20/2020 1511   KETONESUR NEGATIVE 05/20/2020 1511   PROTEINUR NEGATIVE 05/20/2020 1511   NITRITE NEGATIVE 05/20/2020 1511   LEUKOCYTESUR NEGATIVE 05/20/2020 1511   Recent Results (from the past 240 hour(s))  SARS Coronavirus 2 by RT PCR (  hospital order, performed in [facility] hospital lab) Nasopharyngeal Nasopharyngeal Swab     Status: Abnormal   Collection Time: 05/20/20  7:36 PM   Specimen: Nasopharyngeal Swab  Result Value Ref Range Status   SARS Coronavirus 2 POSITIVE (A) NEGATIVE Final    Comment: RESULT CALLED TO, READ BACK BY AND VERIFIED WITH: ADKINS LISA RN [id number] PHILLIPS C (NOTE) SARS-CoV-2 target nucleic acids are DETECTED  SARS-CoV-2 RNA is generally detectable in upper respiratory specimens  during the acute phase of infection.  Positive results are indicative  of the presence of the identified virus, but do not rule out bacterial infection or co-infection with other pathogens not detected by the test.  Clinical correlation with patient history and  other diagnostic information is necessary to determine patient infection status.  The expected result is negative.  Fact Sheet for Patients:   BoilerBrush.com.cy   Fact Sheet for Healthcare Providers:   https://pope.com/    This test is not yet approved or cleared by the [place] FDA and  has been authorized for detection and/or diagnosis of SARS-CoV-2 by FDA under an Emergency Use Authorization  (EUA).  This EUA will remain in effect (meaning this  test can be used) for the duration of  the COVID-19 declaration under Section 564(b)(1) of the Act, 21 U.S.C. section 360-bbb-3(b)(1), unless the authorization is terminated or revoked sooner.  Performed at [facility], [address], [place], [place] [address]       Radiology Studies: CT ABDOMEN PELVIS W CONTRAST  Result Date: 05/20/2020 CLINICAL DATA:  Upper abdominal pain with nausea and vomiting. EXAM: CT ABDOMEN AND PELVIS WITH CONTRAST TECHNIQUE: Multidetector CT imaging of the abdomen and pelvis was performed using the standard protocol following bolus administration of intravenous contrast. CONTRAST:  OMNIPAQUE IOHEXOL 300 MG/ML  SOLN COMPARISON:  None. FINDINGS: Lower chest: No acute abnormality. Hepatobiliary: No focal liver abnormality is seen. No gallstones, gallbladder wall thickening, or biliary dilatation. Pancreas: Unremarkable. No pancreatic ductal dilatation or surrounding inflammatory changes. Spleen: Normal in size without focal abnormality. Adrenals/Urinary Tract: Adrenal glands are unremarkable. Kidneys are normal, without renal calculi, focal lesion, or hydronephrosis. Bladder is unremarkable. Stomach/Bowel: Stomach is within normal limits. The appendix is not clearly identified. No evidence of bowel wall thickening, distention, or inflammatory changes. Vascular/Lymphatic: There is mild calcification of the abdominal aorta and bilateral common iliac arteries without evidence of aneurysmal dilatation. No enlarged abdominal or pelvic lymph nodes. Reproductive: Prostate is unremarkable. Other: No abdominal wall hernia or abnormality. No abdominopelvic ascites. Musculoskeletal: No acute or significant osseous findings. IMPRESSION: 1. No acute intra-abdominal findings. 2. Aortic atherosclerosis. Aortic Atherosclerosis (ICD10-I70.0). Electronically Signed   By: Aram Candela M.D.   On: 05/20/2020 18:07   DG  CHEST PORT 1 VIEW  Result Date: 05/21/2020 CLINICAL DATA:  Abdominal pain with nausea and vomiting for 3 months. EXAM: PORTABLE CHEST 1 VIEW COMPARISON:  None. FINDINGS: Cardiac enlargement. No vascular congestion, edema, or consolidation. No pleural effusions. No pneumothorax. Tortuous aorta. Old right rib fractures. IMPRESSION: Cardiac enlargement.  No active pulmonary disease. Electronically Signed   By: Burman Nieves M.D.   On: 05/21/2020 00:14    Scheduled Meds: . [START ON 05/24/2020] pantoprazole  40 mg Intravenous Q12H  . sodium chloride flush  3 mL Intravenous Once  . sodium chloride flush  3 mL Intravenous Q12H   Continuous Infusions: . pantoprozole (PROTONIX) infusion 8 mg/hr (05/22/20 0155)     LOS: 2 days  Time spent: 35 minutes.  Azucena Fallen, DO [facility] [email or web address] 05/22/2020, 7:44 AM

## 2020-05-23 ENCOUNTER — Inpatient Hospital Stay (HOSPITAL_COMMUNITY): Payer: Self-pay | Admitting: Certified Registered"

## 2020-05-23 ENCOUNTER — Encounter (HOSPITAL_COMMUNITY)
Admission: EM | Disposition: A | Discharge status: Home / Self Care | Payer: Self-pay | Source: Home / Self Care | Attending: Internal Medicine

## 2020-05-23 ENCOUNTER — Encounter (HOSPITAL_COMMUNITY): Payer: Self-pay | Admitting: Internal Medicine

## 2020-05-23 DIAGNOSIS — I517 Cardiomegaly: Secondary | ICD-10-CM

## 2020-05-23 DIAGNOSIS — D509 Iron deficiency anemia, unspecified: Secondary | ICD-10-CM

## 2020-05-23 DIAGNOSIS — D500 Iron deficiency anemia secondary to blood loss (chronic): Secondary | ICD-10-CM

## 2020-05-23 DIAGNOSIS — D569 Thalassemia, unspecified: Principal | ICD-10-CM

## 2020-05-23 DIAGNOSIS — U071 COVID-19: Secondary | ICD-10-CM

## 2020-05-23 DIAGNOSIS — I10 Essential (primary) hypertension: Secondary | ICD-10-CM

## 2020-05-23 DIAGNOSIS — K259 Gastric ulcer, unspecified as acute or chronic, without hemorrhage or perforation: Secondary | ICD-10-CM

## 2020-05-23 DIAGNOSIS — K253 Acute gastric ulcer without hemorrhage or perforation: Secondary | ICD-10-CM

## 2020-05-23 HISTORY — PX: BIOPSY: SHX5522

## 2020-05-23 HISTORY — PX: ESOPHAGOGASTRODUODENOSCOPY (EGD) WITH PROPOFOL: SHX5813

## 2020-05-23 LAB — COMPREHENSIVE METABOLIC PANEL
ALT: 11 U/L (ref 0–44)
AST: 16 U/L (ref 15–41)
Albumin: 3.3 g/dL — ABNORMAL LOW (ref 3.5–5.0)
Alkaline Phosphatase: 56 U/L (ref 38–126)
Anion gap: 6 (ref 5–15)
BUN: 10 mg/dL (ref 6–20)
CO2: 26 mmol/L (ref 22–32)
Calcium: 8.4 mg/dL — ABNORMAL LOW (ref 8.9–10.3)
Chloride: 104 mmol/L (ref 98–111)
Creatinine, Ser: 0.74 mg/dL (ref 0.61–1.24)
GFR calc Af Amer: >60 mL/min (ref >60)
GFR calc non Af Amer: >60 mL/min (ref >60)
Glucose, Bld: 99 mg/dL (ref 70–99)
Potassium: 3.4 mmol/L — ABNORMAL LOW (ref 3.5–5.1)
Sodium: 136 mmol/L (ref 135–145)
Total Bilirubin: 2 mg/dL — ABNORMAL HIGH (ref 0.3–1.2)
Total Protein: 6.2 g/dL — ABNORMAL LOW (ref 6.5–8.1)

## 2020-05-23 LAB — BPAM RBC
Blood Product Expiration Date: 202108242359
Blood Product Expiration Date: 202108242359
Blood Product Expiration Date: 202108242359
Blood Product Expiration Date: 202108242359
Blood Product Expiration Date: 202108262359
Blood Product Expiration Date: 202108262359
ISSUE DATE / TIME: 202108070030
ISSUE DATE / TIME: 202108070359
ISSUE DATE / TIME: 202108070613
ISSUE DATE / TIME: 202108070930
ISSUE DATE / TIME: 202108072107
ISSUE DATE / TIME: 202108072337
Unit Type and Rh: 6200
Unit Type and Rh: 6200
Unit Type and Rh: 6200
Unit Type and Rh: 6200
Unit Type and Rh: 6200
Unit Type and Rh: 6200

## 2020-05-23 LAB — CBC
HCT: 28.2 % — ABNORMAL LOW (ref 39.0–52.0)
Hemoglobin: 8.1 g/dL — ABNORMAL LOW (ref 13.0–17.0)
MCH: 20 pg — ABNORMAL LOW (ref 26.0–34.0)
MCHC: 28.7 g/dL — ABNORMAL LOW (ref 30.0–36.0)
MCV: 69.6 fL — ABNORMAL LOW (ref 80.0–100.0)
Platelets: 255 10*3/uL (ref 150–400)
RBC: 4.05 MIL/uL — ABNORMAL LOW (ref 4.22–5.81)
WBC: 10.2 10*3/uL (ref 4.0–10.5)
nRBC: 0 % (ref 0.0–0.2)

## 2020-05-23 LAB — TYPE AND SCREEN
ABO/RH(D): A POS
Antibody Screen: NEGATIVE
Unit division: 0
Unit division: 0
Unit division: 0
Unit division: 0
Unit division: 0
Unit division: 0

## 2020-05-23 LAB — C-REACTIVE PROTEIN: CRP: 0.7 mg/dL (ref <1.0)

## 2020-05-23 LAB — D-DIMER, QUANTITATIVE: D-Dimer, Quant: 0.6 ug/mL-FEU — ABNORMAL HIGH (ref 0.00–0.50)

## 2020-05-23 SURGERY — ESOPHAGOGASTRODUODENOSCOPY (EGD) WITH PROPOFOL
Anesthesia: Monitor Anesthesia Care

## 2020-05-23 MED ORDER — SODIUM CHLORIDE 0.9 % IV SOLN: INTRAVENOUS | Status: DC

## 2020-05-23 MED ORDER — PANTOPRAZOLE SODIUM 40 MG PO TBEC: 40.0000 mg | DELAYED_RELEASE_TABLET | Freq: Two times a day (BID) | ORAL | 1 refills | Status: AC

## 2020-05-23 MED ORDER — PANTOPRAZOLE SODIUM 40 MG PO TBEC
40.0000 mg | DELAYED_RELEASE_TABLET | Freq: Two times a day (BID) | ORAL | Status: DC
  Administered 2020-05-23: 40 mg via ORAL
  Filled 2020-05-23: qty 1

## 2020-05-23 MED ORDER — FERROUS SULFATE 325 (65 FE) MG PO TBEC
325.0000 mg | DELAYED_RELEASE_TABLET | Freq: Two times a day (BID) | ORAL | 0 refills | Status: AC
Start: 2020-05-23 — End: 2020-07-22

## 2020-05-23 MED ORDER — SODIUM CHLORIDE 0.9 % IV SOLN
510.0000 mg | Freq: Once | INTRAVENOUS | Status: AC
  Administered 2020-05-23: 510 mg via INTRAVENOUS
  Filled 2020-05-23: qty 510

## 2020-05-23 MED ORDER — LACTATED RINGERS IV SOLN
INTRAVENOUS | Status: AC | PRN
  Administered 2020-05-23: 1000 mL via INTRAVENOUS

## 2020-05-23 MED ORDER — PROPOFOL 10 MG/ML IV BOLUS
INTRAVENOUS | Status: DC | PRN
  Administered 2020-05-23: 30 mg via INTRAVENOUS
  Administered 2020-05-23: 20 mg via INTRAVENOUS

## 2020-05-23 MED ORDER — PROPOFOL 500 MG/50ML IV EMUL
INTRAVENOUS | Status: AC
  Filled 2020-05-23: qty 50

## 2020-05-23 MED ORDER — PROPOFOL 500 MG/50ML IV EMUL
INTRAVENOUS | Status: DC | PRN
  Administered 2020-05-23: 125 ug/kg/min via INTRAVENOUS

## 2020-05-23 MED ORDER — LIDOCAINE 2% (20 MG/ML) 5 ML SYRINGE
INTRAMUSCULAR | Status: DC | PRN
  Administered 2020-05-23: 80 mg via INTRAVENOUS

## 2020-05-23 SURGICAL SUPPLY — 14 items

## 2020-05-23 NOTE — Discharge Summary (Addendum)
Physician Discharge Summary  Shawn Hamilton ZOX:[id number] DOB: [birth year] DOA: 05/20/2020  PCP: Patient, No Pcp Per  Admit date: 05/20/2020 Discharge date: 05/23/2020  Admitted From: Home Disposition: Home  Recommendations for Outpatient Follow-up:  1. Follow up with PCP in 1-2 weeks, GI as scheduled for repeat upper endoscopy/lower endoscopy 2. Please obtain BMP/CBC in one week  Home Health: None Equipment/Devices: None  Discharge Condition: Stable CODE STATUS: Full Diet recommendation: As tolerated  Brief/Interim Summary: Shawn Hamilton is a 54 y.o. male with a history of thalassemia and left shoulder pain for which he chronically takes NSAIDs who presented to the ED with several months of exertional dyspnea and an episode of coffee-ground emesis associated with upper abdominal pain. HR was normal, BP was elevated, and hemoglobin was found to be 2.7g/dl with severely microcytic indices. FOBT positive. No acute intraabdominal findings on CT abdomen/pelvis. He was transferred to [facility] ED for urgent transfusions, GI consulted and planning EGD. 4u PRBCs have been given with rise in hgb to 5.8g/dl. 2 additional units ordered.  Patient admitted as above with acute symptomatic anemia noted to be Covid positive.  Patient has very few if any respiratory symptoms but noted coffee-ground emesis prior to admission in the setting of thalassemia and profound symptomatic anemia hemoglobin 2.7.  Patient received 6 units in total with hemoglobin uptrending appropriately above 8 at this point.  Patient had upper endoscopy showing nonbleeding ulcers as per report below.  GI recommending twice daily PPI for the next 8 weeks with repeat upper endoscopy as well as screening lower endoscopy per their schedule.  GI recommending iron supplementation, will continue p.o. iron twice daily, follow with PCP for repeat testing. Patient otherwise stable and agreeable for discharge home, close follow-up with PCP and GI as outlined per the  schedule.  Discharge Diagnoses:  Active Problems:   Severe anemia   COVID-19 virus infection   Upper GI bleed   Essential hypertension   Thalassemia   Cardiomegaly   Iron deficiency anemia due to chronic blood loss   Acute gastric ulcer    Discharge Instructions  Discharge Instructions    Call MD for:  extreme fatigue   Complete by: As directed    Call MD for:  persistant dizziness or light-headedness   Complete by: As directed    Call MD for:  persistant nausea and vomiting   Complete by: As directed    Call MD for:  temperature >100.4   Complete by: As directed    Diet - low sodium heart healthy   Complete by: As directed    Increase activity slowly   Complete by: As directed      Allergies as of 05/23/2020      Reactions   Alfalfa Itching   Sulfa Antibiotics Other (See Comments)   Upset stomach       Medication List    STOP taking these medications   naproxen sodium 220 MG tablet Commonly known as: ALEVE   SLEEP AID PO     TAKE these medications   acetaminophen 500 MG tablet Commonly known as: TYLENOL Take 1,000 mg by mouth every 6 (six) hours as needed for mild pain.   GAS-X PO Take 1 tablet by mouth daily as needed (gas relief).   pantoprazole 40 MG tablet Commonly known as: PROTONIX Take 1 tablet (40 mg total) by mouth 2 (two) times daily before a meal.       Allergies  Allergen Reactions  . Alfalfa Itching  . Sulfa  Antibiotics Other (See Comments)    Upset stomach     Consultations:  GI   Procedures/Studies: CT ABDOMEN PELVIS W CONTRAST  Result Date: 05/20/2020 CLINICAL DATA:  Upper abdominal pain with nausea and vomiting. EXAM: CT ABDOMEN AND PELVIS WITH CONTRAST TECHNIQUE: Multidetector CT imaging of the abdomen and pelvis was performed using the standard protocol following bolus administration of intravenous contrast. CONTRAST:  OMNIPAQUE IOHEXOL 300 MG/ML  SOLN COMPARISON:  None. FINDINGS: Lower chest: No acute abnormality.  Hepatobiliary: No focal liver abnormality is seen. No gallstones, gallbladder wall thickening, or biliary dilatation. Pancreas: Unremarkable. No pancreatic ductal dilatation or surrounding inflammatory changes. Spleen: Normal in size without focal abnormality. Adrenals/Urinary Tract: Adrenal glands are unremarkable. Kidneys are normal, without renal calculi, focal lesion, or hydronephrosis. Bladder is unremarkable. Stomach/Bowel: Stomach is within normal limits. The appendix is not clearly identified. No evidence of bowel wall thickening, distention, or inflammatory changes. Vascular/Lymphatic: There is mild calcification of the abdominal aorta and bilateral common iliac arteries without evidence of aneurysmal dilatation. No enlarged abdominal or pelvic lymph nodes. Reproductive: Prostate is unremarkable. Other: No abdominal wall hernia or abnormality. No abdominopelvic ascites. Musculoskeletal: No acute or significant osseous findings. IMPRESSION: 1. No acute intra-abdominal findings. 2. Aortic atherosclerosis. Aortic Atherosclerosis (ICD10-I70.0). Electronically Signed   By: Aram Candela M.D.   On: 05/20/2020 18:07   DG CHEST PORT 1 VIEW  Result Date: 05/21/2020 CLINICAL DATA:  Abdominal pain with nausea and vomiting for 3 months. EXAM: PORTABLE CHEST 1 VIEW COMPARISON:  None. FINDINGS: Cardiac enlargement. No vascular congestion, edema, or consolidation. No pleural effusions. No pneumothorax. Tortuous aorta. Old right rib fractures. IMPRESSION: Cardiac enlargement.  No active pulmonary disease. Electronically Signed   By: Burman Nieves M.D.   On: 05/21/2020 00:14   ECHOCARDIOGRAM COMPLETE  Result Date: 05/22/2020    ECHOCARDIOGRAM REPORT   Patient Name:   Shawn Hamilton  Date of Exam: 05/22/2020 Medical Rec #:  [id number]  Height:       69.0 in Accession #:    [id number] Weight:       235.0 lb Date of Birth:  1965/12/17  BSA:          2.213 m Patient Age:    54 years   BP:           174/98 mmHg Patient  Gender: M          HR:           89 bpm. Exam Location:  Inpatient Procedure: 2D Echo Indications:    Cardiomegaly I51.7  History:        Patient has no prior history of Echocardiogram examinations.                 COVID-19 Positive; Risk Factors:Hypertension.  Sonographer:    Thurman Coyer RDCS (AE) Referring Phys: [id number] Tyrone Nine IMPRESSIONS  1. Left ventricular ejection fraction, by estimation, is 60 to 65%. The left ventricle has normal function. The left ventricle has no regional wall motion abnormalities. Left ventricular diastolic parameters are consistent with Grade II diastolic dysfunction (pseudonormalization). Elevated left ventricular end-diastolic pressure.  2. Right ventricular systolic function is normal. The right ventricular size is normal. There is moderately elevated pulmonary artery systolic pressure. The estimated right ventricular systolic pressure is 50.3 mmHg.  3. The mitral valve is normal in structure. Mild mitral valve regurgitation. No evidence of mitral stenosis.  4. The aortic valve is normal in structure. Aortic valve regurgitation is mild. Mild  aortic valve sclerosis is present, with no evidence of aortic valve stenosis.  5. Aortic dilatation noted. There is mild dilatation of the aortic root and moderately dilated ascending aorta at 4.5cm.  6. The inferior vena cava is dilated in size with <50% respiratory variability, suggesting right atrial pressure of 15 mmHg.  7. Tricuspid valve regurgitation is moderate.  8. Left atrial size was moderately dilated. FINDINGS  Left Ventricle: Left ventricular ejection fraction, by estimation, is 60 to 65%. The left ventricle has normal function. The left ventricle has no regional wall motion abnormalities. The left ventricular internal cavity size was normal in size. There is  no left ventricular hypertrophy. Left ventricular diastolic parameters are consistent with Grade II diastolic dysfunction (pseudonormalization). Elevated left  ventricular end-diastolic pressure. Right Ventricle: The right ventricular size is normal. No increase in right ventricular wall thickness. Right ventricular systolic function is normal. There is moderately elevated pulmonary artery systolic pressure. The tricuspid regurgitant velocity is 2.97 m/s, and with an assumed right atrial pressure of 15 mmHg, the estimated right ventricular systolic pressure is 50.3 mmHg. Left Atrium: Left atrial size was moderately dilated. Right Atrium: Right atrial size was normal in size. Pericardium: There is no evidence of pericardial effusion. Mitral Valve: The mitral valve is normal in structure. Normal mobility of the mitral valve leaflets. Mild mitral valve regurgitation. No evidence of mitral valve stenosis. Tricuspid Valve: The tricuspid valve is normal in structure. Tricuspid valve regurgitation is moderate . No evidence of tricuspid stenosis. Aortic Valve: The aortic valve is normal in structure. Aortic valve regurgitation is mild. Aortic regurgitation PHT measures 351 msec. Mild aortic valve sclerosis is present, with no evidence of aortic valve stenosis. Pulmonic Valve: The pulmonic valve was normal in structure. Pulmonic valve regurgitation is not visualized. No evidence of pulmonic stenosis. Aorta: Aortic dilatation noted. There is mild dilatation of the aortic root and of the ascending aorta. Venous: The inferior vena cava is dilated in size with less than 50% respiratory variability, suggesting right atrial pressure of 15 mmHg. IAS/Shunts: No atrial level shunt detected by color flow Doppler.  LEFT VENTRICLE PLAX 2D LVOT diam:     2.50 cm  Diastology LVOT Area:     4.91 cm LV e' lateral:   7.87 cm/s                         LV E/e' lateral: 11.8                         LV e' medial:    5.92 cm/s                         LV E/e' medial:  15.7  RIGHT VENTRICLE RV S prime:     11.60 cm/s TAPSE (M-mode): 2.0 cm LEFT ATRIUM              Index       RIGHT ATRIUM           Index  LA Vol (A2C):   117.0 ml 52.88 ml/m RA Area:     22.20 cm LA Vol (A4C):   86.5 ml  39.09 ml/m RA Volume:   61.20 ml  27.66 ml/m LA Biplane Vol: 101.0 ml 45.65 ml/m  AORTIC VALVE AI PHT:      351 msec  AORTA Ao Root diam: 4.20 cm MITRAL VALVE  TRICUSPID VALVE MV Area (PHT): 3.34 cm    TR Peak grad:   35.3 mmHg MV Decel Time: 227 msec    TR Vmax:        297.00 cm/s MV E velocity: 93.00 cm/s MV A velocity: 63.70 cm/s  SHUNTS MV E/A ratio:  1.46        Systemic Diam: 2.50 cm Armanda Magic MD Electronically signed by Armanda Magic MD Signature Date/Time: 05/22/2020/11:03:37 AM    Final    - Upper endoscopy 05/23/20:       "The examined esophagus was normal.      Three non-bleeding cratered gastric ulcers with no stigmata of bleeding       were found in the gastric antrum. The largest lesion was 20 mm in       largest dimension. There are scattered erosions and antral gastritis       without active bleeding. Biopsies were taken with a cold forceps for       histology and Helicobacter pylori testing.      The examined duodenum was normal"   Subjective: No acute issues or events overnight, tolerated procedure quite well, denies nausea, vomiting, diarrhea, constipation, headache, fevers, chills.   Discharge Exam: Vitals:   05/23/20 1411 05/23/20 1417  BP: (!) 164/97 (!) 175/98  Pulse:    Resp: 18 16  Temp: 97.9 F (36.6 C)   SpO2: 97% 97%   Vitals:   05/23/20 1254 05/23/20 1333 05/23/20 1411 05/23/20 1417  BP: (!) 191/100 (!) 187/99 (!) 164/97 (!) 175/98  Pulse: 88     Resp: (!) 22 16 18 16   Temp: 98.9 F (37.2 C)  97.9 F (36.6 C)   TempSrc: Oral  Temporal   SpO2: 96% 96% 97% 97%  Weight:      Height:        General: Pt is alert, awake, not in acute distress Cardiovascular: RRR, S1/S2 +, no rubs, no gallops Respiratory: CTA bilaterally, no wheezing, no rhonchi Abdominal: Soft, NT, ND, bowel sounds + Extremities: no edema, no cyanosis    The results of significant  diagnostics from this hospitalization (including imaging, microbiology, ancillary and laboratory) are listed below for reference.     Microbiology: Recent Results (from the past 240 hour(s))  SARS Coronavirus 2 by RT PCR (hospital order, performed in [facility] hospital lab) Nasopharyngeal Nasopharyngeal Swab     Status: Abnormal   Collection Time: 05/20/20  7:36 PM   Specimen: Nasopharyngeal Swab  Result Value Ref Range Status   SARS Coronavirus 2 POSITIVE (A) NEGATIVE Final    Comment: RESULT CALLED TO, READ BACK BY AND VERIFIED WITH: ADKINS LISA RN [id number] 2105 PHILLIPS C (NOTE) SARS-CoV-2 target nucleic acids are DETECTED  SARS-CoV-2 RNA is generally detectable in upper respiratory specimens  during the acute phase of infection.  Positive results are indicative  of the presence of the identified virus, but do not rule out bacterial infection or co-infection with other pathogens not detected by the test.  Clinical correlation with patient history and  other diagnostic information is necessary to determine patient infection status.  The expected result is negative.  Fact Sheet for Patients:   664403   Fact Sheet for Healthcare Providers:   BoilerBrush.com.cy    This test is not yet approved or cleared by the https://pope.com/ FDA and  has been authorized for detection and/or diagnosis of SARS-CoV-2 by FDA under an Emergency Use Authorization (EUA).  This EUA will remain in effect (meaning  this  test can be used) for the duration of  the COVID-19 declaration under Section 564(b)(1) of the Act, 21 U.S.C. section 360-bbb-3(b)(1), unless the authorization is terminated or revoked sooner.  Performed at [facility], [address] [place], [place] [address]      Labs: BNP (last 3 results) No results for input(s): BNP in the last 8760 hours. Basic Metabolic Panel: Recent Labs  Lab 05/20/20 1511  05/21/20 1228 05/23/20 0531  NA 135 140 136  K 4.1 4.2 3.4*  CL 104 109 104  CO2 20* 21* 26  GLUCOSE 121* 127* 99  BUN 33* 26* 10  CREATININE 0.79 0.91 0.74  CALCIUM 8.2* 8.2* 8.4*  MG  --  2.7*  --   PHOS  --  3.8  --    Liver Function Tests: Recent Labs  Lab 05/20/20 1511 05/21/20 1228 05/23/20 0531  AST 16 16 16   ALT 12 12 11   ALKPHOS 63 57 56  BILITOT 0.9 2.5* 2.0*  PROT 6.3* 6.0* 6.2*  ALBUMIN 3.2* 3.3* 3.3*   Recent Labs  Lab 05/20/20 1511  LIPASE 22   No results for input(s): AMMONIA in the last 168 hours. CBC: Recent Labs  Lab 05/20/20 1554 05/21/20 1228 05/21/20 1600 05/22/20 0642 05/23/20 0531  WBC 9.2 8.0 8.0 8.4 10.2  NEUTROABS 7.2 5.7  --   --   --   HGB 2.7* 5.8* 5.7* 7.7* 8.1*  HCT 12.3* 20.1* 20.0* 25.3* 28.2*  MCV 50.4* 64.6* 64.9* 68.8* 69.6*  PLT 365 285 276 254 255   Cardiac Enzymes: No results for input(s): CKTOTAL, CKMB, CKMBINDEX, TROPONINI in the last 168 hours. BNP: Invalid input(s): POCBNP CBG: No results for input(s): GLUCAP in the last 168 hours. D-Dimer Recent Labs    05/20/20 2346 05/23/20 0531  DDIMER 0.28 0.60*   Hgb A1c No results for input(s): HGBA1C in the last 72 hours. Lipid Profile No results for input(s): CHOL, HDL, LDLCALC, TRIG, CHOLHDL, LDLDIRECT in the last 72 hours. Thyroid function studies Recent Labs    05/21/20 1228  TSH 2.631   Anemia work up Recent Labs    05/20/20 2346  VITAMINB12 371  FOLATE 10.9  FERRITIN 2*  TIBC 459*  IRON 6*  RETICCTPCT 4.5*   Urinalysis    Component Value Date/Time   COLORURINE YELLOW 05/20/2020 1511   APPEARANCEUR CLEAR 05/20/2020 1511   LABSPEC <1.005 (L) 05/20/2020 1511   PHURINE 5.5 05/20/2020 1511   GLUCOSEU NEGATIVE 05/20/2020 1511   HGBUR NEGATIVE 05/20/2020 1511   BILIRUBINUR NEGATIVE 05/20/2020 1511   KETONESUR NEGATIVE 05/20/2020 1511   PROTEINUR NEGATIVE 05/20/2020 1511   NITRITE NEGATIVE 05/20/2020 1511   LEUKOCYTESUR NEGATIVE 05/20/2020  1511   Sepsis Labs Invalid input(s): PROCALCITONIN,  WBC,  LACTICIDVEN Microbiology Recent Results (from the past 240 hour(s))  SARS Coronavirus 2 by RT PCR (hospital order, performed in [facility] hospital lab) Nasopharyngeal Nasopharyngeal Swab     Status: Abnormal   Collection Time: 05/20/20  7:36 PM   Specimen: Nasopharyngeal Swab  Result Value Ref Range Status   SARS Coronavirus 2 POSITIVE (A) NEGATIVE Final    Comment: RESULT CALLED TO, READ BACK BY AND VERIFIED WITH: ADKINS LISA RN 2104 07/20/20 PHILLIPS C (NOTE) SARS-CoV-2 target nucleic acids are DETECTED  SARS-CoV-2 RNA is generally detectable in upper respiratory specimens  during the acute phase of infection.  Positive results are indicative  of the presence of the identified virus, but do not rule out  bacterial infection or co-infection with other pathogens not detected by the test.  Clinical correlation with patient history and  other diagnostic information is necessary to determine patient infection status.  The expected result is negative.  Fact Sheet for Patients:   BoilerBrush.com.cyhttps://www.fda.gov/media/136312/download   Fact Sheet for Healthcare Providers:   https://pope.com/https://www.fda.gov/media/136313/download    This test is not yet approved or cleared by the [place] FDA and  has been authorized for detection and/or diagnosis of SARS-CoV-2 by FDA under an Emergency Use Authorization (EUA).  This EUA will remain in effect (meaning this  test can be used) for the duration of  the COVID-19 declaration under Section 564(b)(1) of the Act, 21 U.S.C. section 360-bbb-3(b)(1), unless the authorization is terminated or revoked sooner.  Performed at [facility] [place], [address] [place], [place] [address]      Time coordinating discharge: Over 30 minutes  SIGNED:   Azucena FallenWilliam C Chazz Philson, DO [facility] 05/23/2020, 2:54 PM Pager   If 7PM-7AM, please contact night-coverage [email or web address]

## 2020-05-23 NOTE — Anesthesia Postprocedure Evaluation (Signed)
Anesthesia Post Note  Patient: Shawn Hamilton  Procedure(s) Performed: ESOPHAGOGASTRODUODENOSCOPY (EGD) WITH PROPOFOL (N/A ) BIOPSY     Patient location during evaluation: PACU Anesthesia Type: MAC Level of consciousness: awake and alert Pain management: pain level controlled Vital Signs Assessment: post-procedure vital signs reviewed and stable Respiratory status: spontaneous breathing, nonlabored ventilation, respiratory function stable and patient connected to nasal cannula oxygen Cardiovascular status: stable and blood pressure returned to baseline Postop Assessment: no apparent nausea or vomiting Anesthetic complications: no   No complications documented.  Last Vitals:  Vitals:   05/23/20 1411 05/23/20 1417  BP: (!) 164/97 (!) 175/98  Pulse:    Resp: 18 16  Temp: 36.6 C   SpO2: 97% 97%    Last Pain:  Vitals:   05/23/20 1417  TempSrc:   PainSc: 0-No pain                 Effie Berkshire

## 2020-05-23 NOTE — Op Note (Signed)
[facility] Patient Name: Shawn Hamilton Procedure Date: 05/23/2020 MRN: [id number] Attending MD: Beverley Fiedler , MD Date of Birth: January 22, 1966 CSN: [id number] Age: 54 Admit Type: Inpatient Procedure:                Upper GI endoscopy Indications:              Iron deficiency anemia, profound anemia Providers:                Carie Caddy. Rhea Belton, MD, Alexandria Lodge RN, RN, Marc Morgans, Technician, Melany Guernsey, Technician Referring MD:             [facility] Medicines:                Monitored Anesthesia Care Complications:            No immediate complications. Estimated Blood Loss:     Estimated blood loss was minimal. Procedure:                Pre-Anesthesia Assessment:                           - Prior to the procedure, a History and Physical                            was performed, and patient medications and                            allergies were reviewed. The patient's tolerance of                            previous anesthesia was also reviewed. The risks                            and benefits of the procedure and the sedation                            options and risks were discussed with the patient.                            All questions were answered, and informed consent                            was obtained. Prior Anticoagulants: The patient has                            taken no previous anticoagulant or antiplatelet                            agents. ASA Grade Assessment: II - A patient with                            mild systemic disease. After reviewing the risks  and benefits, the patient was deemed in                            satisfactory condition to undergo the procedure.                           After obtaining informed consent, the endoscope was                            passed under direct vision. Throughout the                            procedure, the patient's blood  pressure, pulse, and                            oxygen saturations were monitored continuously. The                            [id number]-H190 ([id number]) Olympus gastroscope was                            introduced through the mouth, and advanced to the                            second part of duodenum. The upper GI endoscopy was                            accomplished without difficulty. The patient                            tolerated the procedure well. Scope In: Scope Out: Findings:      The examined esophagus was normal.      Three non-bleeding cratered gastric ulcers with no stigmata of bleeding       were found in the gastric antrum. The largest lesion was 20 mm in       largest dimension. There are scattered erosions and antral gastritis       without active bleeding. Biopsies were taken with a cold forceps for       histology and Helicobacter pylori testing.      The examined duodenum was normal. Impression:               - Normal esophagus.                           - Non-bleeding gastric ulcers with no stigmata of                            bleeding. Biopsied.                           - Normal examined duodenum. Moderate Sedation:      N/A Recommendation:           - Return patient to hospital ward for ongoing care.                           -  Advance diet as tolerated.                           - Continue present medications including BID PPI                            for at least 8 weeks, then daily thereafter.                           - No aspirin, ibuprofen, naproxen, or other                            non-steroidal anti-inflammatory drugs for 8 weeks.                           - Await pathology results.                           - Repeat upper endoscopy in 3 months to check                            healing of gastric ulcers.                           - Screening colonoscopy also recommended.                           - IV iron replacement recommended while                             hospitalized.                           - Discussed directly with patient after EGD. Procedure Code(s):        --- Professional ---                           [phone], Esophagogastroduodenoscopy, flexible,                            transoral; with biopsy, single or multiple Diagnosis Code(s):        --- Professional ---                           K25.9, Gastric ulcer, unspecified as acute or                            chronic, without hemorrhage or perforation                           D50.9, Iron deficiency anemia, unspecified CPT copyright 2019 American Medical Association. All rights reserved. The codes documented in this report are preliminary and upon coder review may  be revised to meet current compliance requirements. Beverley Fiedler, MD 05/23/2020 2:37:48 PM This report has been signed electronically. Number of Addenda: 0

## 2020-05-23 NOTE — Progress Notes (Signed)
[facility] Progress Note  CC:  Hematemesis, anemia   Subjective:  He agrees to proceed with an EGD today as scheduled. He remain NPO since midnight. No N/V or abdominal pain. No BM since admission. He is urinating clear yellow urine. No other complaints at this time.    Objective:  Vital signs in last 24 hours: Temp:  [97.2 F (36.2 C)-99.2 F (37.3 C)] 99.2 F (37.3 C) (08/08 2049) Pulse Rate:  [78-86] 78 (08/08 2049) Resp:  [16-21] 16 (08/08 2049) BP: (175-189)/(95-99) 175/96 (08/08 2049) SpO2:  [97 %-98 %] 97 % (08/08 2049) Last BM Date: 05/19/20   Covid 19 protocol maintained. Appropriate PPE utilized during patient encounter.   General:   Alert 54 year old male in NAD. Heart: RRR, no murmur.  Pulm: Breath sounds clear throughout.  Abdomen: Soft, nondistended. Nontender. + BS x 4 quads. No HSM.  Extremities:  Without edema. Neurologic:  Alert and  oriented x4;  grossly normal neurologically. Psych:  Alert and cooperative. Normal mood and affect.  Intake/Output from previous day: 08/08 0701 - 08/09 0700 In: 720 [P.O.:720] Out: 600 [Urine:600] Intake/Output this shift: No intake/output data recorded.  Lab Results: Recent Labs    05/21/20 1600 05/22/20 0642 05/23/20 0531  WBC 8.0 8.4 10.2  HGB 5.7* 7.7* 8.1*  HCT 20.0* 25.3* 28.2*  PLT 276 254 255   BMET Recent Labs    05/20/20 1511 05/21/20 1228 05/23/20 0531  NA 135 140 136  K 4.1 4.2 3.4*  CL 104 109 104  CO2 20* 21* 26  GLUCOSE 121* 127* 99  BUN 33* 26* 10  CREATININE 0.79 0.91 0.74  CALCIUM 8.2* 8.2* 8.4*   LFT Recent Labs    05/23/20 0531  PROT 6.2*  ALBUMIN 3.3*  AST 16  ALT 11  ALKPHOS 56  BILITOT 2.0*   PT/INR Recent Labs    05/20/20 1510 05/21/20 1228  LABPROT 14.9 14.9  INR 1.2 1.2   Hepatitis Panel No results for input(s): HEPBSAG, HCVAB, HEPAIGM, HEPBIGM in the last 72 hours.  ECHOCARDIOGRAM COMPLETE  Result Date: 05/22/2020    ECHOCARDIOGRAM  REPORT   Patient Name:   BAER HINTON  Date of Exam: 05/22/2020 Medical Rec #:  [id number]  Height:       69.0 in Accession #:    [id number] Weight:       235.0 lb Date of Birth:  1966/05/11  BSA:          2.213 m Patient Age:    54 years   BP:           174/98 mmHg Patient Gender: M          HR:           89 bpm. Exam Location:  Inpatient Procedure: 2D Echo Indications:    Cardiomegaly I51.7  History:        Patient has no prior history of Echocardiogram examinations.                 COVID-19 Positive; Risk Factors:Hypertension.  Sonographer:    Thurman Coyer RDCS (AE) Referring Phys: [id number] IMPRESSIONS  1. Left ventricular ejection fraction, by estimation, is 60 to 65%. The left ventricle has normal function. The left ventricle has no regional wall motion abnormalities. Left ventricular diastolic parameters are consistent with Grade II diastolic dysfunction (pseudonormalization). Elevated left ventricular end-diastolic pressure.  2. Right ventricular systolic function is normal. The right ventricular size is  normal. There is moderately elevated pulmonary artery systolic pressure. The estimated right ventricular systolic pressure is 50.3 mmHg.  3. The mitral valve is normal in structure. Mild mitral valve regurgitation. No evidence of mitral stenosis.  4. The aortic valve is normal in structure. Aortic valve regurgitation is mild. Mild aortic valve sclerosis is present, with no evidence of aortic valve stenosis.  5. Aortic dilatation noted. There is mild dilatation of the aortic root and moderately dilated ascending aorta at 4.5cm.  6. The inferior vena cava is dilated in size with <50% respiratory variability, suggesting right atrial pressure of 15 mmHg.  7. Tricuspid valve regurgitation is moderate.  8. Left atrial size was moderately dilated. FINDINGS  Left Ventricle: Left ventricular ejection fraction, by estimation, is 60 to 65%. The left ventricle has normal function. The left ventricle has no  regional wall motion abnormalities. The left ventricular internal cavity size was normal in size. There is  no left ventricular hypertrophy. Left ventricular diastolic parameters are consistent with Grade II diastolic dysfunction (pseudonormalization). Elevated left ventricular end-diastolic pressure. Right Ventricle: The right ventricular size is normal. No increase in right ventricular wall thickness. Right ventricular systolic function is normal. There is moderately elevated pulmonary artery systolic pressure. The tricuspid regurgitant velocity is 2.97 m/s, and with an assumed right atrial pressure of 15 mmHg, the estimated right ventricular systolic pressure is 50.3 mmHg. Left Atrium: Left atrial size was moderately dilated. Right Atrium: Right atrial size was normal in size. Pericardium: There is no evidence of pericardial effusion. Mitral Valve: The mitral valve is normal in structure. Normal mobility of the mitral valve leaflets. Mild mitral valve regurgitation. No evidence of mitral valve stenosis. Tricuspid Valve: The tricuspid valve is normal in structure. Tricuspid valve regurgitation is moderate . No evidence of tricuspid stenosis. Aortic Valve: The aortic valve is normal in structure. Aortic valve regurgitation is mild. Aortic regurgitation PHT measures 351 msec. Mild aortic valve sclerosis is present, with no evidence of aortic valve stenosis. Pulmonic Valve: The pulmonic valve was normal in structure. Pulmonic valve regurgitation is not visualized. No evidence of pulmonic stenosis. Aorta: Aortic dilatation noted. There is mild dilatation of the aortic root and of the ascending aorta. Venous: The inferior vena cava is dilated in size with less than 50% respiratory variability, suggesting right atrial pressure of 15 mmHg. IAS/Shunts: No atrial level shunt detected by color flow Doppler.  LEFT VENTRICLE PLAX 2D LVOT diam:     2.50 cm  Diastology LVOT Area:     4.91 cm LV e' lateral:   7.87 cm/s                          LV E/e' lateral: 11.8                         LV e' medial:    5.92 cm/s                         LV E/e' medial:  15.7  RIGHT VENTRICLE RV S prime:     11.60 cm/s TAPSE (M-mode): 2.0 cm LEFT ATRIUM              Index       RIGHT ATRIUM           Index LA Vol (A2C):   117.0 ml 52.88 ml/m RA Area:     22.20 cm LA  Vol (A4C):   86.5 ml  39.09 ml/m RA Volume:   61.20 ml  27.66 ml/m LA Biplane Vol: 101.0 ml 45.65 ml/m  AORTIC VALVE AI PHT:      351 msec  AORTA Ao Root diam: 4.20 cm MITRAL VALVE               TRICUSPID VALVE MV Area (PHT): 3.34 cm    TR Peak grad:   35.3 mmHg MV Decel Time: 227 msec    TR Vmax:        297.00 cm/s MV E velocity: 93.00 cm/s MV A velocity: 63.70 cm/s  SHUNTS MV E/A ratio:  1.46        Systemic Diam: 2.50 cm Armanda Magic MD Electronically signed by Armanda Magic MD Signature Date/Time: 05/22/2020/11:03:37 AM    Final     Assessment / Plan:  33. 54 year old male with a hx of Thalassemia admitted to the hospital with profound anemia and hematemesis. + NSAID use. Admission Hg 2.7.  Iron 6. TIBC 459. Ferritin 2. He received a total of 6 units of PRBCs. Hg stable at 8.1. He previously declined EGD evaluation then agreed to pursue. He consents for EGD scheduled today with Dr. Chales Abrahams.  No further evidence of overt GI bleeding. He remains hemodynamically stable. -Continue to monitor H/H daily -Continue Pantoprazole infusion 8mg /hr -Eventual colonoscopy  -Further recommendations to be determined after EGD completed  2. Positive Sars Coronavirus infection, asymptomatic   Active Problems:   Severe anemia   COVID-19 virus infection   Upper GI bleed   Essential hypertension   Thalassemia   Cardiomegaly     LOS: 3 days    05/23/2020, 08:17 AM

## 2020-05-23 NOTE — Transfer of Care (Signed)
Immediate Anesthesia Transfer of Care Note  Patient: Shawn Hamilton  Procedure(s) Performed: ESOPHAGOGASTRODUODENOSCOPY (EGD) WITH PROPOFOL (N/A ) BIOPSY  Patient Location: PACU  Anesthesia Type:MAC  Level of Consciousness: awake, alert  and oriented  Airway & Oxygen Therapy: Patient Spontanous Breathing  Post-op Assessment: Report given to RN and Post -op Vital signs reviewed and stable  Post vital signs: Reviewed and stable  Last Vitals:  Vitals Value Taken Time  BP    Temp    Pulse    Resp    SpO2      Last Pain:  Vitals:   05/23/20 1333  TempSrc:   PainSc: 0-No pain      Patients Stated Pain Goal: 2 (56/43/32 9518)  Complications: No complications documented.

## 2020-05-23 NOTE — Anesthesia Preprocedure Evaluation (Signed)
Anesthesia Evaluation  Patient identified by MRN, date of birth, ID band Patient awake    Reviewed: Allergy & Precautions, NPO status , Patient's Chart, lab work & pertinent test results  Airway Mallampati: II  TM Distance: >3 FB Neck ROM: Full    Dental no notable dental hx.    Pulmonary neg pulmonary ROS, former smoker,    Pulmonary exam normal breath sounds clear to auscultation       Cardiovascular hypertension, Pt. on medications negative cardio ROS Normal cardiovascular exam Rhythm:Regular Rate:Normal     Neuro/Psych negative neurological ROS  negative psych ROS   GI/Hepatic negative GI ROS, Neg liver ROS,   Endo/Other  negative endocrine ROS  Renal/GU negative Renal ROS  negative genitourinary   Musculoskeletal negative musculoskeletal ROS (+)   Abdominal (+) + obese,   Peds negative pediatric ROS (+)  Hematology negative hematology ROS (+)   Anesthesia Other Findings   Reproductive/Obstetrics negative OB ROS                             Anesthesia Physical Anesthesia Plan  ASA: II  Anesthesia Plan: MAC   Post-op Pain Management:    Induction: Intravenous  PONV Risk Score and Plan: 1 and Ondansetron and Treatment may vary due to age or medical condition  Airway Management Planned: Nasal Cannula  Additional Equipment:   Intra-op Plan:   Post-operative Plan:   Informed Consent: I have reviewed the patients History and Physical, chart, labs and discussed the procedure including the risks, benefits and alternatives for the proposed anesthesia with the patient or authorized representative who has indicated his/her understanding and acceptance.     Dental advisory given  Plan Discussed with: CRNA  Anesthesia Plan Comments:         Anesthesia Quick Evaluation

## 2020-05-23 NOTE — Anesthesia Procedure Notes (Signed)
Performed by: [place]a Retana D, CRNA Oxygen Delivery Method: Simple face mask Preoxygenation: POM mask.

## 2020-05-23 NOTE — Progress Notes (Signed)
Pharmacy: IV iron replacement  Patient's a 54 y.o M with hx thalassemia presented to the ED on 8/6 with c/o weakness, coffee-ground emesis, and abdominal pain. Hgb on admission was 2.3 with positive hemeoccult.  He underwent EGD on 8/8.  Pharmacy is consulted to dose IV iron for iron deficiency anemia.  - received 6 units PRBC since admission  - 8/6: iron  low 6, tibc high 459, sat low 1, ferritin low 2  - 8/8 EGD: Three non-bleeding cratered gastric ulcers with no stigmata of bleeding were found in the gastric antrum. Non-bleeding gastric ulcers with no stigmata of  bleeding.  - 8/9: hgb 8.1  Plan: -plan is to discharge pt home this afternoon. D/w Dr. Natale Milch, will give one dose of feraheme 750 mg prior to discharge since this is only infused over 15 min.  Dorna Leitz, PharmD, BCPS 05/23/2020 3:41 PM

## 2020-05-24 ENCOUNTER — Encounter: Payer: Self-pay | Admitting: Internal Medicine

## 2020-05-24 ENCOUNTER — Encounter (HOSPITAL_COMMUNITY): Payer: Self-pay | Admitting: Internal Medicine

## 2020-05-24 ENCOUNTER — Other Ambulatory Visit: Payer: Self-pay

## 2020-05-24 LAB — SURGICAL PATHOLOGY

## 2020-05-25 ENCOUNTER — Other Ambulatory Visit: Payer: Self-pay

## 2020-05-25 ENCOUNTER — Emergency Department (HOSPITAL_COMMUNITY): Payer: Self-pay

## 2020-05-25 ENCOUNTER — Inpatient Hospital Stay (HOSPITAL_COMMUNITY)
Admission: EM | Admit: 2020-05-25 | Discharge: 2020-05-27 | DRG: 391 | Disposition: A | Discharge status: Home / Self Care | Payer: Self-pay | Attending: Internal Medicine | Admitting: Internal Medicine

## 2020-05-25 ENCOUNTER — Encounter (HOSPITAL_COMMUNITY): Payer: Self-pay | Admitting: Obstetrics and Gynecology

## 2020-05-25 DIAGNOSIS — Z91018 Allergy to other foods: Secondary | ICD-10-CM

## 2020-05-25 DIAGNOSIS — I10 Essential (primary) hypertension: Secondary | ICD-10-CM | POA: Diagnosis present

## 2020-05-25 DIAGNOSIS — D509 Iron deficiency anemia, unspecified: Secondary | ICD-10-CM | POA: Diagnosis present

## 2020-05-25 DIAGNOSIS — I071 Rheumatic tricuspid insufficiency: Secondary | ICD-10-CM | POA: Diagnosis present

## 2020-05-25 DIAGNOSIS — Z882 Allergy status to sulfonamides status: Secondary | ICD-10-CM

## 2020-05-25 DIAGNOSIS — K922 Gastrointestinal hemorrhage, unspecified: Secondary | ICD-10-CM | POA: Diagnosis present

## 2020-05-25 DIAGNOSIS — D649 Anemia, unspecified: Secondary | ICD-10-CM

## 2020-05-25 DIAGNOSIS — D500 Iron deficiency anemia secondary to blood loss (chronic): Secondary | ICD-10-CM

## 2020-05-25 DIAGNOSIS — K253 Acute gastric ulcer without hemorrhage or perforation: Secondary | ICD-10-CM

## 2020-05-25 DIAGNOSIS — Z79899 Other long term (current) drug therapy: Secondary | ICD-10-CM

## 2020-05-25 DIAGNOSIS — K5900 Constipation, unspecified: Principal | ICD-10-CM | POA: Diagnosis present

## 2020-05-25 DIAGNOSIS — Z87891 Personal history of nicotine dependence: Secondary | ICD-10-CM

## 2020-05-25 DIAGNOSIS — D569 Thalassemia, unspecified: Secondary | ICD-10-CM | POA: Diagnosis present

## 2020-05-25 DIAGNOSIS — U071 COVID-19: Secondary | ICD-10-CM | POA: Diagnosis present

## 2020-05-25 DIAGNOSIS — Z9114 Patient's other noncompliance with medication regimen: Secondary | ICD-10-CM

## 2020-05-25 DIAGNOSIS — K254 Chronic or unspecified gastric ulcer with hemorrhage: Secondary | ICD-10-CM | POA: Diagnosis present

## 2020-05-25 LAB — COMPREHENSIVE METABOLIC PANEL
ALT: 12 U/L (ref 0–44)
AST: 18 U/L (ref 15–41)
Albumin: 3.3 g/dL — ABNORMAL LOW (ref 3.5–5.0)
Alkaline Phosphatase: 63 U/L (ref 38–126)
Anion gap: 10 (ref 5–15)
BUN: 19 mg/dL (ref 6–20)
CO2: 26 mmol/L (ref 22–32)
Calcium: 8.5 mg/dL — ABNORMAL LOW (ref 8.9–10.3)
Chloride: 102 mmol/L (ref 98–111)
Creatinine, Ser: 1.14 mg/dL (ref 0.61–1.24)
GFR calc Af Amer: >60 mL/min (ref >60)
GFR calc non Af Amer: >60 mL/min (ref >60)
Glucose, Bld: 116 mg/dL — ABNORMAL HIGH (ref 70–99)
Potassium: 3.4 mmol/L — ABNORMAL LOW (ref 3.5–5.1)
Sodium: 138 mmol/L (ref 135–145)
Total Bilirubin: 1.6 mg/dL — ABNORMAL HIGH (ref 0.3–1.2)
Total Protein: 6.5 g/dL (ref 6.5–8.1)

## 2020-05-25 LAB — CBC
HCT: 21.4 % — ABNORMAL LOW (ref 39.0–52.0)
Hemoglobin: 6.1 g/dL — CL (ref 13.0–17.0)
MCH: 20.5 pg — ABNORMAL LOW (ref 26.0–34.0)
MCHC: 28.5 g/dL — ABNORMAL LOW (ref 30.0–36.0)
MCV: 72.1 fL — ABNORMAL LOW (ref 80.0–100.0)
Platelets: 359 10*3/uL (ref 150–400)
RBC: 2.97 MIL/uL — ABNORMAL LOW (ref 4.22–5.81)
WBC: 19.6 10*3/uL — ABNORMAL HIGH (ref 4.0–10.5)
nRBC: 0.2 % (ref 0.0–0.2)

## 2020-05-25 LAB — PREPARE RBC (CROSSMATCH)

## 2020-05-25 LAB — POC OCCULT BLOOD, ED: Fecal Occult Bld: NEGATIVE

## 2020-05-25 MED ORDER — SODIUM CHLORIDE 0.9 % IV SOLN
10.0000 mL/h | Freq: Once | INTRAVENOUS | Status: AC
  Administered 2020-05-25: 10 mL/h via INTRAVENOUS

## 2020-05-25 MED ORDER — POLYETHYLENE GLYCOL 3350 17 G PO PACK
17.0000 g | PACK | Freq: Two times a day (BID) | ORAL | Status: DC
  Administered 2020-05-25 – 2020-05-26 (×2): 17 g via ORAL
  Filled 2020-05-25: qty 1

## 2020-05-25 MED ORDER — POLYETHYLENE GLYCOL 3350 17 G PO PACK
17.0000 g | PACK | Freq: Every day | ORAL | Status: DC
  Administered 2020-05-25: 17 g via ORAL
  Filled 2020-05-25: qty 1

## 2020-05-25 MED ORDER — PANTOPRAZOLE SODIUM 40 MG IV SOLR
40.0000 mg | Freq: Once | INTRAVENOUS | Status: AC
  Administered 2020-05-25: 40 mg via INTRAVENOUS
  Filled 2020-05-25: qty 40

## 2020-05-25 MED ORDER — SORBITOL 70 % SOLN
960.0000 mL | TOPICAL_OIL | Freq: Once | ORAL | Status: AC
  Administered 2020-05-25: 960 mL via RECTAL
  Filled 2020-05-25: qty 473

## 2020-05-25 NOTE — H&P (Signed)
History and Physical  Shawn Hamilton RJJ:[id number] DOB: 1966-06-02 DOA: 05/25/2020  PCP: Patient, No Pcp Per Patient coming from: Home  I have personally briefly reviewed patient's old medical records in [facility]   Chief Complaint: Abdominal pain nausea vomiting  HPI: Shawn Hamilton is a 54 y.o. male past medical history of possible thalassemia recently discharged from the hospital for an upper GI bleed due to to multiple gastric ulcers due to NSAID use he had an EGD at this time that showed 3 ulcers was transfused 6 units of packed red blood cells and his hemoglobin came up to 8.1, was discharged home in stable condition since then he has not been able to take anything orally as he vomits 2 to 3 hours later especially with solids.  He relates he has been able to tolerate some liquid blood in small quantities.  He denies any diarrhea, he has not had a bowel movement since he was discharged, and his urine has been significantly concentrated.  In the ED: Blood pressure has been mildly elevated, he has been satting greater than 94% on room air he was found to have a white count of 19 potassium of 3.4 and an abdominal x-ray was done that showed severe constipation   Review of Systems: All systems reviewed and apart from history of presenting illness, are negative.  Past Medical History:  Diagnosis Date  . Anemia   . Hypertension   . Pituitary gland disorder [facility])    Past Surgical History:  Procedure Laterality Date  . AMPUTATION FINGER    . BIOPSY  05/23/2020   Procedure: BIOPSY;  Surgeon: Beverley Fiedler, MD;  Location: [facility];  Service: Gastroenterology;;  . ESOPHAGOGASTRODUODENOSCOPY (EGD) WITH PROPOFOL N/A 05/23/2020   Procedure: ESOPHAGOGASTRODUODENOSCOPY (EGD) WITH PROPOFOL;  Surgeon: Beverley Fiedler, MD;  Location: [facility];  Service: Gastroenterology;  Laterality: N/A;  . SINUS EXPLORATION     Social History:  reports that he has quit smoking. He has never used smokeless  tobacco. He reports current alcohol use. He reports that he does not use drugs.   Allergies  Allergen Reactions  . Alfalfa Itching  . Sulfa Antibiotics Other (See Comments)    Upset stomach     Family History  Problem Relation Age of Onset  . Diabetes Neg Hx   . Hypertension Neg Hx     Prior to Admission medications   Medication Sig Start Date End Date Taking? Authorizing Provider  acetaminophen (TYLENOL) 500 MG tablet Take 1,000 mg by mouth every 6 (six) hours as needed for mild pain.    [provider]  ferrous sulfate 325 (65 FE) MG EC tablet Take 1 tablet (325 mg total) by mouth 2 (two) times daily. 05/23/20 07/22/20  Azucena Fallen, MD  pantoprazole (PROTONIX) 40 MG tablet Take 1 tablet (40 mg total) by mouth 2 (two) times daily before a meal. 05/23/20 07/22/20  Azucena Fallen, MD  Simethicone (GAS-X PO) Take 1 tablet by mouth daily as needed (gas relief).    [provider]   Physical Exam: Vitals:   05/25/20 1019 05/25/20 1200 05/25/20 1230 05/25/20 1246  BP: (!) 185/110 (!) 164/76 (!) 170/94 (!) 179/93  Pulse: (!) 108 89 88 90  Resp: 18 18 20  (!) 21  Temp: 97.8 F (36.6 C)  98 F (36.7 C) 98.4 F (36.9 C)  TempSrc: Oral  Oral Oral  SpO2: 99% 97% 97% 98%  Weight:      Height:  General exam: Moderately built and nourished patient, lying comfortably supine on the gurney in no obvious distress.  Head, eyes and ENT: Nontraumatic and normocephalic. Pupils equally reacting to light and accommodation. Oral mucosa moist.  Neck: Supple. No JVD, carotid bruit or thyromegaly.  Lymphatics: No lymphadenopathy.  Respiratory system: Clear to auscultation. No increased work of breathing.  Cardiovascular system: S1 and S2 heard, RRR. No JVD, murmurs, gallops, clicks or pedal edema.  Gastrointestinal system: Abdomen is soft and you can feel his a cigar shape that is probably his colon, with some mild tenderness no rebound or guarding  Central  nervous system: Alert and oriented. No focal neurological deficits.  Extremities: Symmetric 5 x 5 power. Peripheral pulses symmetrically felt.   Skin: No rashes or acute findings.  Musculoskeletal system: Negative exam.  Psychiatry: Pleasant and cooperative.   Labs on Admission:  Basic Metabolic Panel: Recent Labs  Lab 05/20/20 1511 05/21/20 1228 05/23/20 0531 05/25/20 1024  NA 135 140 136 138  K 4.1 4.2 3.4* 3.4*  CL 104 109 104 102  CO2 20* 21* 26 26  GLUCOSE 121* 127* 99 116*  BUN 33* 26* 10 19  CREATININE 0.79 0.91 0.74 1.14  CALCIUM 8.2* 8.2* 8.4* 8.5*  MG  --  2.7*  --   --   PHOS  --  3.8  --   --    Liver Function Tests: Recent Labs  Lab 05/20/20 1511 05/21/20 1228 05/23/20 0531 05/25/20 1024  AST 16 16 16 18   ALT 12 12 11 12   ALKPHOS 63 57 56 63  BILITOT 0.9 2.5* 2.0* 1.6*  PROT 6.3* 6.0* 6.2* 6.5  ALBUMIN 3.2* 3.3* 3.3* 3.3*   Recent Labs  Lab 05/20/20 1511  LIPASE 22   No results for input(s): AMMONIA in the last 168 hours. CBC: Recent Labs  Lab 05/20/20 1554 05/20/20 1554 05/21/20 1228 05/21/20 1600 05/22/20 0642 05/23/20 0531 05/25/20 1024  WBC 9.2   < > 8.0 8.0 8.4 10.2 19.6*  NEUTROABS 7.2  --  5.7  --   --   --   --   HGB 2.7*   < > 5.8* 5.7* 7.7* 8.1* 6.1*  HCT 12.3*   < > 20.1* 20.0* 25.3* 28.2* 21.4*  MCV 50.4*   < > 64.6* 64.9* 68.8* 69.6* 72.1*  PLT 365   < > 285 276 254 255 359   < > = values in this interval not displayed.   Cardiac Enzymes: No results for input(s): CKTOTAL, CKMB, CKMBINDEX, TROPONINI in the last 168 hours.  BNP (last 3 results) No results for input(s): PROBNP in the last 8760 hours. CBG: No results for input(s): GLUCAP in the last 168 hours.  Radiological Exams on Admission: DG ABD ACUTE 2+V W 1V CHEST  Result Date: 05/25/2020 CLINICAL DATA:  Abdominal distension EXAM: DG ABDOMEN ACUTE W/ 1V CHEST COMPARISON:  May 20, 2020 fall May 21, 2020 FINDINGS: Air and stool-filled nondilated loops of  bowel. Moderate colonic stool burden diffusely throughout the colon. No free air. Unchanged cardiomediastinal silhouette, enlarged. No new focal consolidation. Degenerative changes of the lower lumbar spine. IMPRESSION: 1. Moderate colonic stool burden diffusely throughout the colon. Nonobstructive bowel gas pattern. 2. No acute cardiopulmonary findings. Electronically Signed   By: May 22, 2020 MD   On: 05/25/2020 11:28    EKG: Independently reviewed. None  Assessment/Plan Severe constipation/obstipation: He has not had a bowel movement in over 7 days. We will start him on a smog enema x2  MiraLAX p.o. twice daily. We will document strict I's and O's, this will definitely resolve his abdominal pain. In a sense he is dynamically obstructed. He is on ferrous sulfate and did receive narcotics on the previous admission which can be contributing to his constipation. He will need to go home on a bowel regimen.  Severe anemia He denies any hematemesis or coffee-ground emesis. Question if this is equilibration, I doubt he has bleeding, as his BUN to creatinine ratio is less than 40/1, and he denies any melanotic stools or any hematemesis, melena or coffee-ground emesis.  He has not been able to take his Protonix twice a day as he has not been able to tolerate anything by mouth. Given 1 unit of packed red blood cells and check a CBC in the morning. We will change his oral Protonix to IV twice daily. The ED physician has already consulted the GI physician.  COVID-19 virus infection Basically asymptomatic he tested positive on 05/20/2020 has been saturating greater than 90% on room air.  Essential hypertension On no antihypertensive medications at home his blood pressure is elevated but he is complaining of abdominal pain.   DVT Prophylaxis: SCD's Code Status: Full  Family Communication: none  Disposition Plan: inpatient     It is my clinical opinion that admission to INPATIENT is  reasonable and necessary in this 54 y.o. male severe constipation has not had a bowel movement in 7 days, with a mild drop in his hemoglobin.  We will give him a smog enema IV fluid hydration transfuse 1 unit of packed red blood cells and recheck a CBC in the morning.  Given the aforementioned, the predictability of an adverse outcome is felt to be significant. I expect that the patient will require at least 2 midnights in the hospital to treat this condition.  Marinda Elk MD [facility]   05/25/2020, 2:12 PM

## 2020-05-25 NOTE — ED Notes (Signed)
Patient had a large BM following enema and miralax.

## 2020-05-25 NOTE — Consult Note (Signed)
Referring Provider:  [facility]         Primary Care Physician:  Patient, No Pcp Per Primary Gastroenterologist: Gentry Fitz            We were asked to see this patient for:   anemia               ASSESSMENT /  PLAN    Shawn Hamilton is a 54 y.o. male PMH significant for, but not necessarily limited to,  PUD, thalassemia , COVID19 infection, pituitary gland disorder.                                                                                                                                   # Acute on chronic anemia / Thalassemia.  --Hgb 6.1, down from 8.1 at time of hospital discharge two days ago after receiving 7 units of blood for hgb ~ 2.  --No overt GI bleeding since hospital discharge. Brown stool on DRE --Hgb could be equilibrating after receiving so many units of blood over last several days.  --He has already been transfused another unit of blood in ED today. Will repeat CBC in am.   # PUD in setting of NSAIDS --3 cratered, non-bleeding gastric ulcers on EGD 05/23/20. No H.pylori on biopsies.  --Patient hospital discharge prescriptions are in [facility] and he hasn't been able to pick them up so hasn't been on PPI.  --Recommend BID PPI, PO is okay --Clear liquids okay, will possibly advance diet tomorrow if still no bleeding.   # Generalized abdominal cramping / bloating. Reason for ED visit today --moderate stool burden on plain films.  --ED has ordered an enema which is reasonable. If pain not relieved with enema / BM then recommend CT scan. The gastric ulcers were deep, rule out contained perforation. ( seems unlikely based on his exam today).  # Constipation --Not usually a problem but no BM in a week.  --Miralax daily while hospitalized then as needed at home.  --At some point, need to make sure he is up to date on colon cancer screening     HPI:    Chief Complaint: abdominal pain and bloating  Shawn Hamilton is a 54 y.o. male who was hospitalized a few days ago  for profound anemia, coffee ground emesis.  He has a history of Thalassemia.  His baseline hemoglobin was unknown but he presented at 2.7.  That admission he required 6 units of blood in addition to a dose of IV iron.  Inpatient EGD remarkable for 3 cratered, nonbleeding gastric ulcers , largest measuring 20 mm. He had been taking NSAIDS. Bx negative for H.pylori.   On day of discharge, this past Monday,  his hgb was up to 8.1.  Patient says when he got home on Monday,  and intermittently during recent admission,  he was having generalized abdominal cramping but the cramping got worse on Monday night at home.  He had chills Monday but did not check his temperature. He has started to feel bloated.  He did have one episode of vomiting on Monday after eating applesauce.  Emesis consisted of applesauce and fluid, nothing bloody/dark. He has not been able to pick up prescriptions provided to him at discharge including protonix.   Patient has not had a bowel movement in 1 week.  Plain films of the abdomen showed moderate stool burden. He doesn't typically have problems with constipation. He hasn't treated the constipation with anything at home.      In the ED hemoglobin found to be 6.1.  Patient has not had any gastrointestinal bleeding.    PREVIOUS ENDOSCOPIC EVALUATIONS / GI STUDIES :  EGD 05/23/20  Findings: Esophagus was normal. Three non-bleeding cratered gastric ulcers with no stigmata of bleeding were found in the gastric antrum. The largest lesion was 20 mm in largest dimension. There are scattered erosions and antral gastritis without active bleeding. Biopsies were taken with a cold forceps for histology and Helicobacter pylori Testing.  STOMACH, ANTRUM AND BODY, BIOPSY:  - Antral and oxyntic mucosa with slight chronic inflammation.  - Warthin-Starry negative for Helicobacter pylori.  - No intestinal metaplasia, dysplasia or carcinoma.   Past Medical History:  Diagnosis Date  . Anemia   .  Hypertension   . Pituitary gland disorder [facility])     Past Surgical History:  Procedure Laterality Date  . AMPUTATION FINGER    . BIOPSY  05/23/2020   Procedure: BIOPSY;  Surgeon: Beverley Fiedler, MD;  Location: [facility];  Service: Gastroenterology;;  . ESOPHAGOGASTRODUODENOSCOPY (EGD) WITH PROPOFOL N/A 05/23/2020   Procedure: ESOPHAGOGASTRODUODENOSCOPY (EGD) WITH PROPOFOL;  Surgeon: Beverley Fiedler, MD;  Location: [facility];  Service: Gastroenterology;  Laterality: N/A;  . SINUS EXPLORATION      Prior to Admission medications   Medication Sig Start Date End Date Taking? Authorizing Provider  acetaminophen (TYLENOL) 500 MG tablet Take 1,000 mg by mouth every 6 (six) hours as needed for mild pain.    [provider]  ferrous sulfate 325 (65 FE) MG EC tablet Take 1 tablet (325 mg total) by mouth 2 (two) times daily. 05/23/20 07/22/20  Azucena Fallen, MD  pantoprazole (PROTONIX) 40 MG tablet Take 1 tablet (40 mg total) by mouth 2 (two) times daily before a meal. 05/23/20 07/22/20  Azucena Fallen, MD  Simethicone (GAS-X PO) Take 1 tablet by mouth daily as needed (gas relief).    [provider]    Current Facility-Administered Medications  Medication Dose Route Frequency Provider Last Rate Last Admin  . polyethylene glycol (MIRALAX / GLYCOLAX) packet 17 g  17 g Oral Daily Marinda Elk, MD   17 g at 05/25/20 1500   Current Outpatient Medications  Medication Sig Dispense Refill  . acetaminophen (TYLENOL) 500 MG tablet Take 1,000 mg by mouth every 6 (six) hours as needed for mild pain.    . ferrous sulfate 325 (65 FE) MG EC tablet Take 1 tablet (325 mg total) by mouth 2 (two) times daily. 120 tablet 0  . pantoprazole (PROTONIX) 40 MG tablet Take 1 tablet (40 mg total) by mouth 2 (two) times daily before a meal. 60 tablet 1  . Simethicone (GAS-X PO) Take 1 tablet by mouth daily as needed (gas relief).      Allergies as of 05/25/2020 - Review Complete 05/23/2020    Allergen Reaction Noted  . Alfalfa Itching 05/20/2020  . Sulfa antibiotics Other (See Comments)  05/20/2020    Family History  Problem Relation Age of Onset  . Diabetes Neg Hx   . Hypertension Neg Hx     Social History   Socioeconomic History  . Marital status: Single    Spouse name: Not on file  . Number of children: Not on file  . Years of education: Not on file  . Highest education level: Not on file  Occupational History  . Not on file  Tobacco Use  . Smoking status: Former Games developermoker  . Smokeless tobacco: Never Used  Substance and Sexual Activity  . Alcohol use: Yes    Comment: occasionally  . Drug use: Never  . Sexual activity: Not on file  Other Topics Concern  . Not on file  Social History Narrative  . Not on file   Social Determinants of Health   Financial Resource Strain:   . Difficulty of Paying Living Expenses:   Food Insecurity:   . Worried About Programme researcher, broadcasting/film/videounning Out of Food in the Last Year:   . Baristaan Out of Food in the Last Year:   Transportation Needs:   . Freight forwarderLack of Transportation (Medical):   Marland Kitchen. Lack of Transportation (Non-Medical):   Physical Activity:   . Days of Exercise per Week:   . Minutes of Exercise per Session:   Stress:   . Feeling of Stress :   Social Connections:   . Frequency of Communication with Friends and Family:   . Frequency of Social Gatherings with Friends and Family:   . Attends Religious Services:   . Active Member of Clubs or Organizations:   . Attends BankerClub or Organization Meetings:   Marland Kitchen. Marital Status:   Intimate Partner Violence:   . Fear of Current or Ex-Partner:   . Emotionally Abused:   Marland Kitchen. Physically Abused:   . Sexually Abused:     Review of Systems: All systems reviewed and negative except where noted in HPI.  Physical Exam: Vital signs in last 24 hours: Temp:  [97.8 F (36.6 C)-98.4 F (36.9 C)] 98.3 F (36.8 C) (08/11 1506) Pulse Rate:  [88-108] 95 (08/11 1506) Resp:  [18-21] 19 (08/11 1506) BP:  (164-185)/(76-110) 177/88 (08/11 1506) SpO2:  [96 %-99 %] 96 % (08/11 1506) Weight:  [107 kg] 107 kg (08/11 1008)   General:   Alert, well-developed,  male in NAD Psych:  Pleasant, cooperative. Normal mood and affect. Eyes:  Pupils equal, sclera clear, no icterus.   Conjunctiva pink. Ears:  Normal auditory acuity. Nose:  No deformity, discharge,  or lesions. Neck:  Supple; no masses Lungs:  Clear throughout to auscultation.   No wheezes, crackles, or rhonchi.  Heart:  Regular rate and rhythm; no murmurs, no lower extremity edema Abdomen:  Soft, non-distended, mild central tenderness. BS active, no palp mass   Rectal:  Scant brown stool in vault.  Msk:  Symmetrical without gross deformities. . Neurologic:  Alert and  oriented x4;  grossly normal neurologically. Skin:  Intact without significant lesions or rashes.   Intake/Output from previous day: No intake/output data recorded. Intake/Output this shift: Total I/O In: 653.8 [I.V.:23.8; Blood:630] Out: -   Lab Results: Recent Labs    05/23/20 0531 05/25/20 1024  WBC 10.2 19.6*  HGB 8.1* 6.1*  HCT 28.2* 21.4*  PLT 255 359   BMET Recent Labs    05/23/20 0531 05/25/20 1024  NA 136 138  K 3.4* 3.4*  CL 104 102  CO2 26 26  GLUCOSE 99 116*  BUN 10 19  CREATININE 0.74 1.14  CALCIUM 8.4* 8.5*   LFT Recent Labs    05/25/20 1024  PROT 6.5  ALBUMIN 3.3*  AST 18  ALT 12  ALKPHOS 63  BILITOT 1.6*   PT/INR No results for input(s): LABPROT, INR in the last 72 hours. Hepatitis Panel No results for input(s): HEPBSAG, HCVAB, HEPAIGM, HEPBIGM in the last 72 hours.   . CBC Latest Ref Rng & Units 05/25/2020 05/23/2020 05/22/2020  WBC 4.0 - 10.5 K/uL 19.6(H) 10.2 8.4  Hemoglobin 13.0 - 17.0 g/dL 6.1(LL) 8.1(L) 7.7(L)  Hematocrit 39 - 52 % 21.4(L) 28.2(L) 25.3(L)  Platelets 150 - 400 K/uL 359 255 254    . CMP Latest Ref Rng & Units 05/25/2020 05/23/2020 05/21/2020  Glucose 70 - 99 mg/dL 809(X) 99 833(A)  BUN 6 - 20 mg/dL 19  10 25(K)  Creatinine 0.61 - 1.24 mg/dL 5.39 7.67 3.41  Sodium 135 - 145 mmol/L 138 136 140  Potassium 3.5 - 5.1 mmol/L 3.4(L) 3.4(L) 4.2  Chloride 98 - 111 mmol/L 102 104 109  CO2 22 - 32 mmol/L 26 26 21(L)  Calcium 8.9 - 10.3 mg/dL 9.3(X) 9.0(W) 4.0(X)  Total Protein 6.5 - 8.1 g/dL 6.5 7.3(Z) 6.0(L)  Total Bilirubin 0.3 - 1.2 mg/dL 3.2(D) 2.0(H) 2.5(H)  Alkaline Phos 38 - 126 U/L 63 56 57  AST 15 - 41 U/L 18 16 16   ALT 0 - 44 U/L 12 11 12    Studies/Results: DG ABD ACUTE 2+V W 1V CHEST  Result Date: 05/25/2020 CLINICAL DATA:  Abdominal distension EXAM: DG ABDOMEN ACUTE W/ 1V CHEST COMPARISON:  May 20, 2020 fall May 21, 2020 FINDINGS: Air and stool-filled nondilated loops of bowel. Moderate colonic stool burden diffusely throughout the colon. No free air. Unchanged cardiomediastinal silhouette, enlarged. No new focal consolidation. Degenerative changes of the lower lumbar spine. IMPRESSION: 1. Moderate colonic stool burden diffusely throughout the colon. Nonobstructive bowel gas pattern. 2. No acute cardiopulmonary findings. Electronically Signed   By: May 22, 2020 MD   On: 05/25/2020 11:28    Active Problems:   Severe anemia   COVID-19 virus infection   Essential hypertension   GI bleed   Obstipation    Meda Klinefelter, NP-C @  05/25/2020, 3:25 PM

## 2020-05-25 NOTE — ED Notes (Signed)
PT stood briefly, PT expressed he did not feel comfortable at this time walking due to he is having episodes of right leg spasms.

## 2020-05-25 NOTE — ED Provider Notes (Signed)
[facility]-EMERGENCY DEPT Provider Note   CSN: [id number] Arrival date & time: 05/25/20  1002     History Chief Complaint  Patient presents with  . COVID Positive    Shawn Hamilton is a 54 y.o. male.  Patient with a history of thalassemia and ulcers.  Patient recently was admitted to the hospital with peptic ulcers and anemia.  His hemoglobin was 2.7 and he was transfused up to 8.  Patient was discharged 2 days ago and had not been taking his medicines.  He states that his belly feels distended and having some discomfort  The history is provided by the patient. No language interpreter was used.  Abdominal Pain Pain location:  Epigastric Pain quality: aching   Pain radiates to:  Does not radiate Pain severity:  Mild Onset quality:  Sudden Timing:  Constant Progression:  Waxing and waning Chronicity:  Recurrent Context: not alcohol use   Relieved by:  Nothing Associated symptoms: no chest pain, no cough, no diarrhea, no fatigue and no hematuria        Past Medical History:  Diagnosis Date  . Anemia   . Hypertension   . Pituitary gland disorder [facility]     Patient Active Problem List   Diagnosis Date Noted  . GI bleed 05/25/2020  . Constipation 05/25/2020  . Iron deficiency anemia due to chronic blood loss   . Acute gastric ulcer   . Cardiomegaly 05/21/2020  . Severe anemia 05/20/2020  . COVID-19 virus infection 05/20/2020  . Upper GI bleed 05/20/2020  . Essential hypertension 05/20/2020  . Thalassemia 05/20/2020    Past Surgical History:  Procedure Laterality Date  . AMPUTATION FINGER    . BIOPSY  05/23/2020   Procedure: BIOPSY;  Surgeon: Beverley Fiedler, MD;  Location: [facility];  Service: Gastroenterology;;  . ESOPHAGOGASTRODUODENOSCOPY (EGD) WITH PROPOFOL N/A 05/23/2020   Procedure: ESOPHAGOGASTRODUODENOSCOPY (EGD) WITH PROPOFOL;  Surgeon: Beverley Fiedler, MD;  Location: [facility];  Service: Gastroenterology;  Laterality: N/A;  . SINUS  EXPLORATION         Family History  Problem Relation Age of Onset  . Diabetes Neg Hx   . Hypertension Neg Hx     Social History   Tobacco Use  . Smoking status: Former Games developer  . Smokeless tobacco: Never Used  Substance Use Topics  . Alcohol use: Yes    Comment: occasionally  . Drug use: Never    Home Medications Prior to Admission medications   Medication Sig Start Date End Date Taking? Authorizing Provider  acetaminophen (TYLENOL) 500 MG tablet Take 1,000 mg by mouth every 6 (six) hours as needed for mild pain.    [provider]  ferrous sulfate 325 (65 FE) MG EC tablet Take 1 tablet (325 mg total) by mouth 2 (two) times daily. 05/23/20 07/22/20  Azucena Fallen, MD  pantoprazole (PROTONIX) 40 MG tablet Take 1 tablet (40 mg total) by mouth 2 (two) times daily before a meal. 05/23/20 07/22/20  Azucena Fallen, MD  Simethicone (GAS-X PO) Take 1 tablet by mouth daily as needed (gas relief).    [provider]    Allergies    Alfalfa and Sulfa antibiotics  Review of Systems   Review of Systems  Constitutional: Negative for appetite change and fatigue.  HENT: Negative for congestion, ear discharge and sinus pressure.   Eyes: Negative for discharge.  Respiratory: Negative for cough.   Cardiovascular: Negative for chest pain.  Gastrointestinal: Positive for abdominal pain.  Negative for diarrhea.  Genitourinary: Negative for frequency and hematuria.  Musculoskeletal: Negative for back pain.  Skin: Negative for rash.  Neurological: Negative for seizures and headaches.  Psychiatric/Behavioral: Negative for hallucinations.    Physical Exam Updated Vital Signs BP (!) 179/93 (BP Location: Left Arm)   Pulse 90   Temp 98.4 F (36.9 C) (Oral)   Resp (!) 21   Ht 5\' 9"  (1.753 m)   Wt 107 kg   SpO2 98%   BMI 34.84 kg/m   Physical Exam Vitals and nursing note reviewed.  Constitutional:      Appearance: He is well-developed.  HENT:     Head:  Normocephalic.     Nose: Nose normal.  Eyes:     General: No scleral icterus.    Conjunctiva/sclera: Conjunctivae normal.  Neck:     Thyroid: No thyromegaly.  Cardiovascular:     Rate and Rhythm: Normal rate and regular rhythm.     Heart sounds: No murmur heard.  No friction rub. No gallop.   Pulmonary:     Breath sounds: No stridor. No wheezing or rales.  Chest:     Chest wall: No tenderness.  Abdominal:     General: There is no distension.     Tenderness: There is abdominal tenderness. There is no rebound.  Musculoskeletal:        General: Normal range of motion.     Cervical back: Neck supple.  Lymphadenopathy:     Cervical: No cervical adenopathy.  Skin:    Findings: No erythema or rash.  Neurological:     Mental Status: He is oriented to person, place, and time.     Motor: No abnormal muscle tone.     Coordination: Coordination normal.  Psychiatric:        Behavior: Behavior normal.     ED Results / Procedures / Treatments   Labs (all labs ordered are listed, but only abnormal results are displayed) Labs Reviewed  COMPREHENSIVE METABOLIC PANEL - Abnormal; Notable for the following components:      Result Value   Potassium 3.4 (*)    Glucose, Bld 116 (*)    Calcium 8.5 (*)    Albumin 3.3 (*)    Total Bilirubin 1.6 (*)    All other components within normal limits  CBC - Abnormal; Notable for the following components:   WBC 19.6 (*)    RBC 2.97 (*)    Hemoglobin 6.1 (*)    HCT 21.4 (*)    MCV 72.1 (*)    MCH 20.5 (*)    MCHC 28.5 (*)    All other components within normal limits  POC OCCULT BLOOD, ED  TYPE AND SCREEN  PREPARE RBC (CROSSMATCH)    EKG None  Radiology DG ABD ACUTE 2+V W 1V CHEST  Result Date: 05/25/2020 CLINICAL DATA:  Abdominal distension EXAM: DG ABDOMEN ACUTE W/ 1V CHEST COMPARISON:  May 20, 2020 fall May 21, 2020 FINDINGS: Air and stool-filled nondilated loops of bowel. Moderate colonic stool burden diffusely throughout the  colon. No free air. Unchanged cardiomediastinal silhouette, enlarged. No new focal consolidation. Degenerative changes of the lower lumbar spine. IMPRESSION: 1. Moderate colonic stool burden diffusely throughout the colon. Nonobstructive bowel gas pattern. 2. No acute cardiopulmonary findings. Electronically Signed   By: May 23, 2020 MD   On: 05/25/2020 11:28    Procedures Procedures (including critical care time)  Medications Ordered in ED Medications  sorbitol, milk of mag, mineral oil, glycerin (SMOG) enema (has  no administration in time range)  polyethylene glycol (MIRALAX / GLYCOLAX) packet 17 g (has no administration in time range)  pantoprazole (PROTONIX) injection 40 mg (40 mg Intravenous Given 05/25/20 1203)  0.9 %  sodium chloride infusion (10 mL/hr Intravenous New Bag/Given 05/25/20 1245)    ED Course  I have reviewed the triage vital signs and the nursing notes.  Pertinent labs & imaging results that were available during my care of the patient were reviewed by me and considered in my medical decision making (see chart for details). CRITICAL CARE Performed by: Bethann Berkshire Total critical care time: 40 minutes Critical care time was exclusive of separately billable procedures and treating other patients. Critical care was necessary to treat or prevent imminent or life-threatening deterioration. Critical care was time spent personally by me on the following activities: development of treatment plan with patient and/or surrogate as well as nursing, discussions with consultants, evaluation of patient's response to treatment, examination of patient, obtaining history from patient or surrogate, ordering and performing treatments and interventions, ordering and review of laboratory studies, ordering and review of radiographic studies, pulse oximetry and re-evaluation of patient's condition.    MDM Rules/Calculators/A&P                         Patient with abdominal discomfort  and anemia.  Patient has peptic ulcer disease.  He will be admitted to medicine and transfused with GI consult. [facility] has been contacted and will see the patient       This patient presents to the ED for concern of abdominal pain, this involves an extensive number of treatment options, and is a complaint that carries with it a high risk of complications and morbidity.  The differential diagnosis includes gastritis   Lab Tests:   I Ordered, reviewed, and interpreted labs, which included CBC and chemistries that showed anemia at 6.1  Medicines ordered:   I ordered medication packed red blood cells for anemia  Imaging Studies ordered:   I ordered imaging studies which included acute abdominal series and  I independently visualized and interpreted imaging which showed unremarkable  Additional history obtained:   Additional history obtained from old records  Previous records obtained and reviewed.  Consultations Obtained:   I consulted GI and hospitalist and discussed lab and imaging findings  Reevaluation:  After the interventions stated above, I reevaluated the patient and found no change  Critical Interventions:  . Transfusing 2 units of blood  Final Clinical Impression(s) / ED Diagnoses Final diagnoses:  None    Rx / DC Orders ED Discharge Orders    None       Bethann Berkshire, MD 05/25/20 1416

## 2020-05-25 NOTE — ED Triage Notes (Signed)
Patient reports he is COVID positive since 05/20/2020. Patient reports feeling weakness and bloated. Patient reports abdominal cramping.

## 2020-05-26 LAB — CBC
HCT: 24 % — ABNORMAL LOW (ref 39.0–52.0)
Hemoglobin: 7.1 g/dL — ABNORMAL LOW (ref 13.0–17.0)
MCH: 22.5 pg — ABNORMAL LOW (ref 26.0–34.0)
MCHC: 29.6 g/dL — ABNORMAL LOW (ref 30.0–36.0)
MCV: 75.9 fL — ABNORMAL LOW (ref 80.0–100.0)
Platelets: 279 10*3/uL (ref 150–400)
RBC: 3.16 MIL/uL — ABNORMAL LOW (ref 4.22–5.81)
WBC: 17.3 10*3/uL — ABNORMAL HIGH (ref 4.0–10.5)
nRBC: 0.1 % (ref 0.0–0.2)

## 2020-05-26 LAB — BPAM RBC
Blood Product Expiration Date: 202108312359
Blood Product Expiration Date: 202109012359
ISSUE DATE / TIME: 202108111210
ISSUE DATE / TIME: 202108111713
Unit Type and Rh: 6200
Unit Type and Rh: 6200

## 2020-05-26 LAB — TYPE AND SCREEN
ABO/RH(D): A POS
Antibody Screen: NEGATIVE
Unit division: 0
Unit division: 0

## 2020-05-26 MED ORDER — SORBITOL 70 % SOLN: 960.0000 mL | TOPICAL_OIL | Freq: Once | ORAL | Status: DC

## 2020-05-26 MED ORDER — HYDRALAZINE HCL 20 MG/ML IJ SOLN
10.0000 mg | Freq: Once | INTRAMUSCULAR | Status: AC
  Administered 2020-05-26: 10 mg via INTRAVENOUS
  Filled 2020-05-26: qty 1

## 2020-05-26 MED ORDER — POLYETHYLENE GLYCOL 3350 17 G PO PACK: 17.0000 g | PACK | Freq: Every day | ORAL | Status: DC | PRN

## 2020-05-26 MED ORDER — PEG 3350-KCL-NA BICARB-NACL 420 G PO SOLR
4000.0000 mL | Freq: Once | ORAL | Status: DC
  Filled 2020-05-26: qty 4000

## 2020-05-26 MED ORDER — SORBITOL 70 % SOLN
960.0000 mL | TOPICAL_OIL | Freq: Once | ORAL | Status: DC
  Filled 2020-05-26: qty 473

## 2020-05-26 MED ORDER — SODIUM CHLORIDE 0.9 % IV SOLN
510.0000 mg | Freq: Once | INTRAVENOUS | Status: AC
  Administered 2020-05-26: 510 mg via INTRAVENOUS
  Filled 2020-05-26: qty 510

## 2020-05-26 MED ORDER — PANTOPRAZOLE SODIUM 40 MG IV SOLR
40.0000 mg | Freq: Two times a day (BID) | INTRAVENOUS | Status: DC
  Administered 2020-05-26: 40 mg via INTRAVENOUS
  Filled 2020-05-26: qty 40

## 2020-05-26 NOTE — ED Notes (Signed)
Pt has been sleeping soundly all night.  NAD noted.

## 2020-05-26 NOTE — ED Notes (Signed)
Patient requested that one side rail on bed be left down so he could get to bedside toilet.

## 2020-05-26 NOTE — Progress Notes (Signed)
PROGRESS NOTE    Shawn Hamilton  CLE:[id number] DOB: Jan 08, 1966 DOA: 05/25/2020 PCP: Patient, No Pcp Per   Brief Narrative:  Shawn Hamilton is a 54 y.o. male past medical history of possible thalassemia recently discharged from the hospital for an upper GI bleed due to to multiple gastric ulcers due to NSAID use he had an EGD at this time that showed 3 ulcers was transfused 6 units of packed red blood cells and his hemoglobin came up to 8.1, was discharged home in stable condition since then he has not been able to take anything orally as he vomits 2 to 3 hours later especially with solids.  He relates he has been able to tolerate some liquid foods in small quantities.  He denies any diarrhea, he has not had a bowel movement since he was discharged, and his urine has been significantly concentrated. In ED: Bood pressure has been mildly elevated, he has been satting greater than 94% on room air he was found to have a white count of 19 potassium of 3.4 and an abdominal x-ray was done that showed severe constipation   Assessment & Plan:   Principal Problem:   Obstipation Active Problems:   Severe anemia   COVID-19 virus infection   Essential hypertension   GI bleed   Acute severe constipation/obstipation: - Patient reports no bowel movement in over 7 days prior to admission, admits to at least 2 small bowel movements overnight denies any recent flatulence - Continue MiraLAX p.o. twice daily, strict I's and O's - Patient was initiated on ferrous sulfate but has not taken this medication yet  - Recently on narcotics in the setting of recent hospitalization, again has not yet picked these outpatient medications up.   -Continue to advance bowel prep to ensure improvement in symptoms, will order GoLYTELY today and have patient drink 8 ounces every hour until bowel movement. -GI following, appreciate insight and recommendations  Severe anemia, acute on chronic in the setting of profound iron deficiency,  thalassemia and questionable GI losses - Reinitiated Feraheme - Received 1 dose earlier this week with plans to get repeat later this week/next with GI - will dose now - He denies any recent hematemesis or coffee-ground emesis or melena since discharge. -Patient has been noncompliant with discharge medications including but not limited to Protonix, iron  -Status post 1 unit transfusion yesterday, repeat H&H 7.1  -Continue Protonix to IV twice daily.  -GI following - appreciate insight and recommendations.  COVID-19 virus infection with previous recovery Currently asymptomatic he tested positive on 05/20/2020 has been saturating greater than 90% on room air.  Essential hypertension On no antihypertensive medications at home his blood pressure is elevated but he is complaining of abdominal pain.   DVT Prophylaxis: SCD's Code Status: Full  Family Communication: None available  Status is: Inpatient  Dispo: The patient is from: Home              Anticipated d/c is to: Home              Anticipated d/c date is: 48 to 72 hours              Patient currently not medically stable for discharge given ongoing need for evaluation for acute anemia as above  Consultants:   GI  Procedures:   None indicated  Antimicrobials:  None indicated  Subjective: No acute issues or events overnight, patient states his abdominal pain is moderately improving, 2 small bowel movements overnight but otherwise  denies flatus denies nausea, vomiting, diarrhea, headache, fevers or chills.  Objective: Vitals:   05/26/20 0543 05/26/20 0558 05/26/20 0630 05/26/20 0659  BP: (!) 193/102 (!) 186/104 (!) 179/99 (!) 176/102  Pulse: 91 82 80 80  Resp:    17  Temp:      TempSrc:      SpO2: 97% 96% 95% 96%  Weight:      Height:        Intake/Output Summary (Last 24 hours) at 05/26/2020 0813 Last data filed at 05/25/2020 1735 Gross per 24 hour  Intake 968.83 ml  Output --  Net 968.83 ml   Filed Weights    05/25/20 1008  Weight: 107 kg    Examination:  General exam: Appears calm and comfortable  Respiratory system: Clear to auscultation. Respiratory effort normal. Cardiovascular system: S1 & S2 heard, RRR. No JVD, murmurs, rubs, gallops or clicks. No pedal edema. Gastrointestinal system: Abdomen is nondistended, soft and nontender. No organomegaly or masses felt. Normal bowel sounds heard. Central nervous system: Alert and oriented. No focal neurological deficits. Extremities: Symmetric 5 x 5 power. Skin: No rashes, lesions or ulcers Psychiatry: Judgement and insight appear normal. Mood & affect appropriate.     Data Reviewed: I have personally reviewed following labs and imaging studies  CBC: Recent Labs  Lab 05/20/20 1554 05/20/20 1554 05/21/20 1228 05/21/20 1600 05/22/20 0642 05/23/20 0531 05/25/20 1024  WBC 9.2   < > 8.0 8.0 8.4 10.2 19.6*  NEUTROABS 7.2  --  5.7  --   --   --   --   HGB 2.7*   < > 5.8* 5.7* 7.7* 8.1* 6.1*  HCT 12.3*   < > 20.1* 20.0* 25.3* 28.2* 21.4*  MCV 50.4*   < > 64.6* 64.9* 68.8* 69.6* 72.1*  PLT 365   < > 285 276 254 255 359   < > = values in this interval not displayed.   Basic Metabolic Panel: Recent Labs  Lab 05/20/20 1511 05/21/20 1228 05/23/20 0531 05/25/20 1024  NA 135 140 136 138  K 4.1 4.2 3.4* 3.4*  CL 104 109 104 102  CO2 20* 21* 26 26  GLUCOSE 121* 127* 99 116*  BUN 33* 26* 10 19  CREATININE 0.79 0.91 0.74 1.14  CALCIUM 8.2* 8.2* 8.4* 8.5*  MG  --  2.7*  --   --   PHOS  --  3.8  --   --    GFR: Estimated Creatinine Clearance: 88.2 mL/min (by C-G formula based on SCr of 1.14 mg/dL). Liver Function Tests: Recent Labs  Lab 05/20/20 1511 05/21/20 1228 05/23/20 0531 05/25/20 1024  AST 16 16 16 18   ALT 12 12 11 12   ALKPHOS 63 57 56 63  BILITOT 0.9 2.5* 2.0* 1.6*  PROT 6.3* 6.0* 6.2* 6.5  ALBUMIN 3.2* 3.3* 3.3* 3.3*   Recent Labs  Lab 05/20/20 1511  LIPASE 22   No results for input(s): AMMONIA in the last 168  hours. Coagulation Profile: Recent Labs  Lab 05/20/20 1510 05/21/20 1228  INR 1.2 1.2   Cardiac Enzymes: No results for input(s): CKTOTAL, CKMB, CKMBINDEX, TROPONINI in the last 168 hours. BNP (last 3 results) No results for input(s): PROBNP in the last 8760 hours. HbA1C: No results for input(s): HGBA1C in the last 72 hours. CBG: No results for input(s): GLUCAP in the last 168 hours. Lipid Profile: No results for input(s): CHOL, HDL, LDLCALC, TRIG, CHOLHDL, LDLDIRECT in the last 72 hours. Thyroid Function Tests: No  results for input(s): TSH, T4TOTAL, FREET4, T3FREE, THYROIDAB in the last 72 hours. Anemia Panel: No results for input(s): VITAMINB12, FOLATE, FERRITIN, TIBC, IRON, RETICCTPCT in the last 72 hours. Sepsis Labs: Recent Labs  Lab 05/20/20 2346  PROCALCITON <0.10    Recent Results (from the past 240 hour(s))  SARS Coronavirus 2 by RT PCR (hospital order, performed in [facility] hospital lab) Nasopharyngeal Nasopharyngeal Swab     Status: Abnormal   Collection Time: 05/20/20  7:36 PM   Specimen: Nasopharyngeal Swab  Result Value Ref Range Status   SARS Coronavirus 2 POSITIVE (A) NEGATIVE Final    Comment: RESULT CALLED TO, READ BACK BY AND VERIFIED WITH: ADKINS LISA RN [phone] PHILLIPS C (NOTE) SARS-CoV-2 target nucleic acids are DETECTED  SARS-CoV-2 RNA is generally detectable in upper respiratory specimens  during the acute phase of infection.  Positive results are indicative  of the presence of the identified virus, but do not rule out bacterial infection or co-infection with other pathogens not detected by the test.  Clinical correlation with patient history and  other diagnostic information is necessary to determine patient infection status.  The expected result is negative.  Fact Sheet for Patients:   BoilerBrush.com.cy   Fact Sheet for Healthcare Providers:   https://pope.com/    This test is not  yet approved or cleared by the [place] FDA and  has been authorized for detection and/or diagnosis of SARS-CoV-2 by FDA under an Emergency Use Authorization (EUA).  This EUA will remain in effect (meaning this  test can be used) for the duration of  the COVID-19 declaration under Section 564(b)(1) of the Act, 21 U.S.C. section 360-bbb-3(b)(1), unless the authorization is terminated or revoked sooner.  Performed at [facility], [address] [place], [place] [address]          Radiology Studies: DG ABD ACUTE 2+V W 1V CHEST  Result Date: 05/25/2020 CLINICAL DATA:  Abdominal distension EXAM: DG ABDOMEN ACUTE W/ 1V CHEST COMPARISON:  May 20, 2020 fall May 21, 2020 FINDINGS: Air and stool-filled nondilated loops of bowel. Moderate colonic stool burden diffusely throughout the colon. No free air. Unchanged cardiomediastinal silhouette, enlarged. No new focal consolidation. Degenerative changes of the lower lumbar spine. IMPRESSION: 1. Moderate colonic stool burden diffusely throughout the colon. Nonobstructive bowel gas pattern. 2. No acute cardiopulmonary findings. Electronically Signed   By: Meda Klinefelter MD   On: 05/25/2020 11:28    Scheduled Meds: . polyethylene glycol  17 g Oral BID  . sorbitol, milk of mag, mineral oil, glycerin (SMOG) enema  960 mL Rectal Once    LOS: 1 day   Time spent: 45 min  Azucena Fallen, DO [facility]  If 7PM-7AM, please contact night-coverage [email or web address]  05/26/2020, 8:13 AM

## 2020-05-27 LAB — COMPREHENSIVE METABOLIC PANEL
ALT: 13 U/L (ref 0–44)
AST: 35 U/L (ref 15–41)
Albumin: 2.9 g/dL — ABNORMAL LOW (ref 3.5–5.0)
Alkaline Phosphatase: 69 U/L (ref 38–126)
Anion gap: 9 (ref 5–15)
BUN: 11 mg/dL (ref 6–20)
CO2: 25 mmol/L (ref 22–32)
Calcium: 8.3 mg/dL — ABNORMAL LOW (ref 8.9–10.3)
Chloride: 102 mmol/L (ref 98–111)
Creatinine, Ser: 0.76 mg/dL (ref 0.61–1.24)
GFR calc Af Amer: >60 mL/min (ref >60)
GFR calc non Af Amer: >60 mL/min (ref >60)
Glucose, Bld: 95 mg/dL (ref 70–99)
Potassium: 3.4 mmol/L — ABNORMAL LOW (ref 3.5–5.1)
Sodium: 136 mmol/L (ref 135–145)
Total Bilirubin: 2.4 mg/dL — ABNORMAL HIGH (ref 0.3–1.2)
Total Protein: 6 g/dL — ABNORMAL LOW (ref 6.5–8.1)

## 2020-05-27 LAB — CBC
HCT: 25.6 % — ABNORMAL LOW (ref 39.0–52.0)
Hemoglobin: 7.5 g/dL — ABNORMAL LOW (ref 13.0–17.0)
MCH: 22.6 pg — ABNORMAL LOW (ref 26.0–34.0)
MCHC: 29.3 g/dL — ABNORMAL LOW (ref 30.0–36.0)
MCV: 77.1 fL — ABNORMAL LOW (ref 80.0–100.0)
Platelets: 294 K/uL (ref 150–400)
RBC: 3.32 MIL/uL — ABNORMAL LOW (ref 4.22–5.81)
WBC: 14.9 K/uL — ABNORMAL HIGH (ref 4.0–10.5)
nRBC: 0 % (ref 0.0–0.2)

## 2020-05-27 MED ORDER — POLYETHYLENE GLYCOL 3350 17 G PO PACK: 17.0000 g | PACK | Freq: Three times a day (TID) | ORAL | 0 refills | Status: AC | PRN

## 2020-05-27 MED ORDER — SENNOSIDES-DOCUSATE SODIUM 8.6-50 MG PO TABS: 1.0000 | ORAL_TABLET | Freq: Two times a day (BID) | ORAL | 0 refills | Status: AC

## 2020-05-27 MED FILL — FERROUS SULFATE 325 MG TAB: 325 (65 FE) | 50 days supply | Qty: 100 | Fill #0 | Status: TO

## 2020-05-27 MED FILL — PANTOPRAZOLE SOD DR 40 MG T: 40 | 30 days supply | Qty: 60 | Fill #0 | Status: TO

## 2020-05-27 NOTE — ED Notes (Signed)
Pt reports upper abdomen is still sore, but feels it's because he was constipated.  Reports he is afraid to eat solid food d/t abdominal pain.  Pt remains pleasant.  NAD noted.

## 2020-05-27 NOTE — Discharge Summary (Signed)
Physician Discharge Summary  Robyn Nohr KTG:[id number] DOB: Jul 27, 1966 DOA: 05/25/2020  PCP: Patient, No Pcp Per  Admit date: 05/25/2020 Discharge date: 05/27/2020  Admitted From: Home Disposition: Home  Recommendations for Outpatient Follow-up:  1. Follow up with PCP in 1-2 weeks 2. Please obtain BMP/CBC in one week  Discharge Condition: Stable CODE STATUS: Full Diet recommendation: Advance as tolerated  Brief/Interim Summary: Suzanne Garbers a 54 y.o.malepast medical history of possible thalassemia recently discharged from the hospital for an upper GI bleed due to to multiple gastric ulcers due to NSAID use he had an EGD at this time that showed 3 ulcers was transfused 6 units of packed red blood cells and his hemoglobin came up to 8.1, was discharged home in stable condition since then he has not been able to take anything orally as he vomits 2 to 3 hours later especially with solids. He relates he has been able to tolerate some liquid foods in small quantities. He denies any diarrhea, he has not had a bowel movement since he was discharged, and his urine has been significantly concentrated. In ED: Bood pressure has been mildly elevated, he has been satting greater than 94% on room air he was found to have a white count of 19 potassium of 3.4 and an abdominal x-ray was done that showed severe constipation.   Patient admitted with worsening abdominal pain constipation and poor p.o. intake as above.  Unclear etiology although patient has had recent upper endoscopy and started on p.o. iron prior to discharge from last hospitalization.  Patient did not take any of these medications that were prescribed to him, his hemoglobin on repeat admission was somewhat diminished from previous but with 1 unit PRBC his hemoglobin responded appropriately back to within the limits.  Repeat labs the following day shows ongoing up trending hemoglobin and denies any recent bleeding dark stool or other signs of blood  loss.  GI was reconsulted on admission, recommended treatment for constipation and follow symptomatology.  Patient symptoms resolved with supportive care, MiraLAX, GoLYTELY and Senokot, multiple stools were noted in patient's abdominal pain and distention resolved.  Patient's hemoglobin continued to trend upwards and patient was otherwise stable and agreeable for discharge home.  Lengthy conversation at bedside daily about need for medication compliance and close follow-up with PCP, repeat labs within 1 week to ensure resolution of anemia.  Discharge Diagnoses:  Principal Problem:   Obstipation Active Problems:   Severe anemia   COVID-19 virus infection   Essential hypertension   GI bleed    Discharge Instructions  Discharge Instructions    Call MD for:  extreme fatigue   Complete by: As directed    Call MD for:  persistant nausea and vomiting   Complete by: As directed    Call MD for:  redness, tenderness, or signs of infection (pain, swelling, redness, odor or green/yellow discharge around incision site)   Complete by: As directed    Call MD for:  severe uncontrolled pain   Complete by: As directed    Call MD for:  temperature >100.4   Complete by: As directed    Diet - low sodium heart healthy   Complete by: As directed    Increase activity slowly   Complete by: As directed      Allergies as of 05/27/2020      Reactions   Alfalfa Itching   Sulfa Antibiotics Other (See Comments)   Upset stomach       Medication List  TAKE these medications   acetaminophen 500 MG tablet Commonly known as: TYLENOL Take 1,000 mg by mouth every 6 (six) hours as needed for mild pain.   ferrous sulfate 325 (65 FE) MG EC tablet Take 1 tablet (325 mg total) by mouth 2 (two) times daily.   GAS-X PO Take 1 tablet by mouth daily as needed (gas relief).   pantoprazole 40 MG tablet Commonly known as: PROTONIX Take 1 tablet (40 mg total) by mouth 2 (two) times daily before a meal.    polyethylene glycol 17 g packet Commonly known as: MIRALAX / GLYCOLAX Take 17 g by mouth 3 (three) times daily with meals as needed for mild constipation or moderate constipation.   senna-docusate 8.6-50 MG tablet Commonly known as: Senna S Take 1 tablet by mouth 2 (two) times daily.       Allergies  Allergen Reactions  . Alfalfa Itching  . Sulfa Antibiotics Other (See Comments)    Upset stomach     Consultations: GI  Procedures/Studies: CT ABDOMEN PELVIS W CONTRAST  Result Date: 05/20/2020 CLINICAL DATA:  Upper abdominal pain with nausea and vomiting. EXAM: CT ABDOMEN AND PELVIS WITH CONTRAST TECHNIQUE: Multidetector CT imaging of the abdomen and pelvis was performed using the standard protocol following bolus administration of intravenous contrast. CONTRAST:  100mL OMNIPAQUE IOHEXOL 300 MG/ML  SOLN COMPARISON:  None. FINDINGS: Lower chest: No acute abnormality. Hepatobiliary: No focal liver abnormality is seen. No gallstones, gallbladder wall thickening, or biliary dilatation. Pancreas: Unremarkable. No pancreatic ductal dilatation or surrounding inflammatory changes. Spleen: Normal in size without focal abnormality. Adrenals/Urinary Tract: Adrenal glands are unremarkable. Kidneys are normal, without renal calculi, focal lesion, or hydronephrosis. Bladder is unremarkable. Stomach/Bowel: Stomach is within normal limits. The appendix is not clearly identified. No evidence of bowel wall thickening, distention, or inflammatory changes. Vascular/Lymphatic: There is mild calcification of the abdominal aorta and bilateral common iliac arteries without evidence of aneurysmal dilatation. No enlarged abdominal or pelvic lymph nodes. Reproductive: Prostate is unremarkable. Other: No abdominal wall hernia or abnormality. No abdominopelvic ascites. Musculoskeletal: No acute or significant osseous findings. IMPRESSION: 1. No acute intra-abdominal findings. 2. Aortic atherosclerosis. Aortic  Atherosclerosis (ICD10-I70.0). Electronically Signed   By: Aram Candelahaddeus  Houston M.D.   On: 05/20/2020 18:07   DG CHEST PORT 1 VIEW  Result Date: 05/21/2020 CLINICAL DATA:  Abdominal pain with nausea and vomiting for 3 months. EXAM: PORTABLE CHEST 1 VIEW COMPARISON:  None. FINDINGS: Cardiac enlargement. No vascular congestion, edema, or consolidation. No pleural effusions. No pneumothorax. Tortuous aorta. Old right rib fractures. IMPRESSION: Cardiac enlargement.  No active pulmonary disease. Electronically Signed   By: Burman NievesWilliam  Stevens M.D.   On: 05/21/2020 00:14   DG ABD ACUTE 2+V W 1V CHEST  Result Date: 05/25/2020 CLINICAL DATA:  Abdominal distension EXAM: DG ABDOMEN ACUTE W/ 1V CHEST COMPARISON:  May 20, 2020 fall May 21, 2020 FINDINGS: Air and stool-filled nondilated loops of bowel. Moderate colonic stool burden diffusely throughout the colon. No free air. Unchanged cardiomediastinal silhouette, enlarged. No new focal consolidation. Degenerative changes of the lower lumbar spine. IMPRESSION: 1. Moderate colonic stool burden diffusely throughout the colon. Nonobstructive bowel gas pattern. 2. No acute cardiopulmonary findings. Electronically Signed   By: Meda KlinefelterStephanie  Peacock MD   On: 05/25/2020 11:28   ECHOCARDIOGRAM COMPLETE  Result Date: 05/22/2020    ECHOCARDIOGRAM REPORT   Patient Name:   Sidonie DickensCRAIG Wain  Date of Exam: 05/22/2020 Medical Rec #:  [id number]  Height:  69.0 in Accession #:    [id number] Weight:       235.0 lb Date of Birth:  November 20, 1965  BSA:          2.213 m Patient Age:    54 years   BP:           174/98 mmHg Patient Gender: M          HR:           89 bpm. Exam Location:  Inpatient Procedure: 2D Echo Indications:    Cardiomegaly I51.7  History:        Patient has no prior history of Echocardiogram examinations.                 COVID-19 Positive; Risk Factors:Hypertension.  Sonographer:    Thurman Coyer RDCS (AE) Referring Phys: [id number] IMPRESSIONS  1. Left ventricular  ejection fraction, by estimation, is 60 to 65%. The left ventricle has normal function. The left ventricle has no regional wall motion abnormalities. Left ventricular diastolic parameters are consistent with Grade II diastolic dysfunction (pseudonormalization). Elevated left ventricular end-diastolic pressure.  2. Right ventricular systolic function is normal. The right ventricular size is normal. There is moderately elevated pulmonary artery systolic pressure. The estimated right ventricular systolic pressure is 50.3 mmHg.  3. The mitral valve is normal in structure. Mild mitral valve regurgitation. No evidence of mitral stenosis.  4. The aortic valve is normal in structure. Aortic valve regurgitation is mild. Mild aortic valve sclerosis is present, with no evidence of aortic valve stenosis.  5. Aortic dilatation noted. There is mild dilatation of the aortic root and moderately dilated ascending aorta at 4.5cm.  6. The inferior vena cava is dilated in size with <50% respiratory variability, suggesting right atrial pressure of 15 mmHg.  7. Tricuspid valve regurgitation is moderate.  8. Left atrial size was moderately dilated. FINDINGS  Left Ventricle: Left ventricular ejection fraction, by estimation, is 60 to 65%. The left ventricle has normal function. The left ventricle has no regional wall motion abnormalities. The left ventricular internal cavity size was normal in size. There is  no left ventricular hypertrophy. Left ventricular diastolic parameters are consistent with Grade II diastolic dysfunction (pseudonormalization). Elevated left ventricular end-diastolic pressure. Right Ventricle: The right ventricular size is normal. No increase in right ventricular wall thickness. Right ventricular systolic function is normal. There is moderately elevated pulmonary artery systolic pressure. The tricuspid regurgitant velocity is 2.97 m/s, and with an assumed right atrial pressure of 15 mmHg, the estimated right  ventricular systolic pressure is 50.3 mmHg. Left Atrium: Left atrial size was moderately dilated. Right Atrium: Right atrial size was normal in size. Pericardium: There is no evidence of pericardial effusion. Mitral Valve: The mitral valve is normal in structure. Normal mobility of the mitral valve leaflets. Mild mitral valve regurgitation. No evidence of mitral valve stenosis. Tricuspid Valve: The tricuspid valve is normal in structure. Tricuspid valve regurgitation is moderate . No evidence of tricuspid stenosis. Aortic Valve: The aortic valve is normal in structure. Aortic valve regurgitation is mild. Aortic regurgitation PHT measures 351 msec. Mild aortic valve sclerosis is present, with no evidence of aortic valve stenosis. Pulmonic Valve: The pulmonic valve was normal in structure. Pulmonic valve regurgitation is not visualized. No evidence of pulmonic stenosis. Aorta: Aortic dilatation noted. There is mild dilatation of the aortic root and of the ascending aorta. Venous: The inferior vena cava is dilated in size with less than 50% respiratory variability,  suggesting right atrial pressure of 15 mmHg. IAS/Shunts: No atrial level shunt detected by color flow Doppler.  LEFT VENTRICLE PLAX 2D LVOT diam:     2.50 cm  Diastology LVOT Area:     4.91 cm LV e' lateral:   7.87 cm/s                         LV E/e' lateral: 11.8                         LV e' medial:    5.92 cm/s                         LV E/e' medial:  15.7  RIGHT VENTRICLE RV S prime:     11.60 cm/s TAPSE (M-mode): 2.0 cm LEFT ATRIUM              Index       RIGHT ATRIUM           Index LA Vol (A2C):   117.0 ml 52.88 ml/m RA Area:     22.20 cm LA Vol (A4C):   86.5 ml  39.09 ml/m RA Volume:   61.20 ml  27.66 ml/m LA Biplane Vol: 101.0 ml 45.65 ml/m  AORTIC VALVE AI PHT:      351 msec  AORTA Ao Root diam: 4.20 cm MITRAL VALVE               TRICUSPID VALVE MV Area (PHT): 3.34 cm    TR Peak grad:   35.3 mmHg MV Decel Time: 227 msec    TR Vmax:         297.00 cm/s MV E velocity: 93.00 cm/s MV A velocity: 63.70 cm/s  SHUNTS MV E/A ratio:  1.46        Systemic Diam: 2.50 cm Armanda Magic MD Electronically signed by Armanda Magic MD Signature Date/Time: 05/22/2020/11:03:37 AM    Final      Subjective: No acute issues or events overnight denies nausea vomiting diarrhea constipation headache fevers or chills   Discharge Exam: Vitals:   05/27/20 0631 05/27/20 0720  BP: (!) 195/118 (!) 176/111  Pulse: 84 77  Resp:  17  Temp:    SpO2: 92% 94%   Vitals:   05/27/20 0300 05/27/20 0618 05/27/20 0631 05/27/20 0720  BP: (!) 179/90 (!) 190/125 (!) 195/118 (!) 176/111  Pulse: 90 88 84 77  Resp: 16   17  Temp:      TempSrc:      SpO2: 97% 96% 92% 94%  Weight:      Height:        General: Pt is alert, awake, not in acute distress Cardiovascular: RRR, S1/S2 +, no rubs, no gallops Respiratory: CTA bilaterally, no wheezing, no rhonchi Abdominal: Soft, NT, ND, bowel sounds + Extremities: no edema, no cyanosis    The results of significant diagnostics from this hospitalization (including imaging, microbiology, ancillary and laboratory) are listed below for reference.     Microbiology: Recent Results (from the past 240 hour(s))  SARS Coronavirus 2 by RT PCR (hospital order, performed in [facility] hospital lab) Nasopharyngeal Nasopharyngeal Swab     Status: Abnormal   Collection Time: 05/20/20  7:36 PM   Specimen: Nasopharyngeal Swab  Result Value Ref Range Status   SARS Coronavirus 2 POSITIVE (A) NEGATIVE Final    Comment: RESULT CALLED TO, READ BACK  BY AND VERIFIED WITH: ADKINS LISA RN [id number] PHILLIPS C (NOTE) SARS-CoV-2 target nucleic acids are DETECTED  SARS-CoV-2 RNA is generally detectable in upper respiratory specimens  during the acute phase of infection.  Positive results are indicative  of the presence of the identified virus, but do not rule out bacterial infection or co-infection with other pathogens not detected by the  test.  Clinical correlation with patient history and  other diagnostic information is necessary to determine patient infection status.  The expected result is negative.  Fact Sheet for Patients:   BoilerBrush.com.cy   Fact Sheet for Healthcare Providers:   https://pope.com/    This test is not yet approved or cleared by the [place] FDA and  has been authorized for detection and/or diagnosis of SARS-CoV-2 by FDA under an Emergency Use Authorization (EUA).  This EUA will remain in effect (meaning this  test can be used) for the duration of  the COVID-19 declaration under Section 564(b)(1) of the Act, 21 U.S.C. section 360-bbb-3(b)(1), unless the authorization is terminated or revoked sooner.  Performed at [facility], [address] [place], [place] [address]      Labs: BNP (last 3 results) No results for input(s): BNP in the last 8760 hours. Basic Metabolic Panel: Recent Labs  Lab 05/20/20 1511 05/21/20 1228 05/23/20 0531 05/25/20 1024 05/27/20 0448  NA 135 140 136 138 136  K 4.1 4.2 3.4* 3.4* 3.4*  CL 104 109 104 102 102  CO2 20* 21* GLUCOSE 121* 127* 99 116* 95  BUN 33* 26* CREATININE 0.79 0.91 0.74 1.14 0.76  CALCIUM 8.2* 8.2* 8.4* 8.5* 8.3*  MG  --  2.7*  --   --   --   PHOS  --  3.8  --   --   --    Liver Function Tests: Recent Labs  Lab 05/20/20 1511 05/21/20 1228 05/23/20 0531 05/25/20 1024 05/27/20 0448  AST 35  ALT ALKPHOS 63 57 56 63 69  BILITOT 0.9 2.5* 2.0* 1.6* 2.4*  PROT 6.3* 6.0* 6.2* 6.5 6.0*  ALBUMIN 3.2* 3.3* 3.3* 3.3* 2.9*   Recent Labs  Lab 05/20/20 1511  LIPASE 22   No results for input(s): AMMONIA in the last 168 hours. CBC: Recent Labs  Lab 05/20/20 1554 05/20/20 1554 05/21/20 1228 05/21/20 1600 05/22/20 0642 05/23/20 0531 05/25/20 1024 05/26/20 1029 05/27/20 0448  WBC 9.2   < > 8.0   < > 8.4 10.2 19.6*  17.3* 14.9*  NEUTROABS 7.2  --  5.7  --   --   --   --   --   --   HGB 2.7*   < > 5.8*   < > 7.7* 8.1* 6.1* 7.1* 7.5*  HCT 12.3*   < > 20.1*   < > 25.3* 28.2* 21.4* 24.0* 25.6*  MCV 50.4*   < > 64.6*   < > 68.8* 69.6* 72.1* 75.9* 77.1*  PLT 365   < > 285   < > 254 255 359 279 294   < > = values in this interval not displayed.   Cardiac Enzymes: No results for input(s): CKTOTAL, CKMB, CKMBINDEX, TROPONINI in the last 168 hours. BNP: Invalid input(s): POCBNP CBG: No results for input(s): GLUCAP in the last 168 hours. D-Dimer No results for input(s): DDIMER in the last 72 hours. Hgb A1c No results for input(s): HGBA1C  in the last 72 hours. Lipid Profile No results for input(s): CHOL, HDL, LDLCALC, TRIG, CHOLHDL, LDLDIRECT in the last 72 hours. Thyroid function studies No results for input(s): TSH, T4TOTAL, T3FREE, THYROIDAB in the last 72 hours.  Invalid input(s): FREET3 Anemia work up No results for input(s): VITAMINB12, FOLATE, FERRITIN, TIBC, IRON, RETICCTPCT in the last 72 hours. Urinalysis    Component Value Date/Time   COLORURINE YELLOW 05/20/2020 1511   APPEARANCEUR CLEAR 05/20/2020 1511   LABSPEC <1.005 (L) 05/20/2020 1511   PHURINE 5.5 05/20/2020 1511   GLUCOSEU NEGATIVE 05/20/2020 1511   HGBUR NEGATIVE 05/20/2020 1511   BILIRUBINUR NEGATIVE 05/20/2020 1511   KETONESUR NEGATIVE 05/20/2020 1511   PROTEINUR NEGATIVE 05/20/2020 1511   NITRITE NEGATIVE 05/20/2020 1511   LEUKOCYTESUR NEGATIVE 05/20/2020 1511   Sepsis Labs Invalid input(s): PROCALCITONIN,  WBC,  LACTICIDVEN Microbiology Recent Results (from the past 240 hour(s))  SARS Coronavirus 2 by RT PCR (hospital order, performed in [facility] hospital lab) Nasopharyngeal Nasopharyngeal Swab     Status: Abnormal   Collection Time: 05/20/20  7:36 PM   Specimen: Nasopharyngeal Swab  Result Value Ref Range Status   SARS Coronavirus 2 POSITIVE (A) NEGATIVE Final    Comment: RESULT CALLED TO, READ BACK BY AND  VERIFIED WITH: ADKINS LISA RN [id number] PHILLIPS C (NOTE) SARS-CoV-2 target nucleic acids are DETECTED  SARS-CoV-2 RNA is generally detectable in upper respiratory specimens  during the acute phase of infection.  Positive results are indicative  of the presence of the identified virus, but do not rule out bacterial infection or co-infection with other pathogens not detected by the test.  Clinical correlation with patient history and  other diagnostic information is necessary to determine patient infection status.  The expected result is negative.  Fact Sheet for Patients:   BoilerBrush.com.cy   Fact Sheet for Healthcare Providers:   https://pope.com/    This test is not yet approved or cleared by the [place] FDA and  has been authorized for detection and/or diagnosis of SARS-CoV-2 by FDA under an Emergency Use Authorization (EUA).  This EUA will remain in effect (meaning this  test can be used) for the duration of  the COVID-19 declaration under Section 564(b)(1) of the Act, 21 U.S.C. section 360-bbb-3(b)(1), unless the authorization is terminated or revoked sooner.  Performed at [facility], [address] [place], [place] [address]      Time coordinating discharge: Over 30 minutes  SIGNED:   Azucena Fallen, DO [facility] 05/27/2020, 8:29 AM Pager   If 7PM-7AM, please contact night-coverage [email or web address]

## 2020-05-27 NOTE — ED Notes (Signed)
Pt cleared for d/c. No available transportation home. Safe transport contacted. Awaiting call back.

## 2020-05-27 NOTE — ED Notes (Signed)
Assumed care of pt at this time. Pt resting in stretcher, no s/sx of acute distress at this time.  

## 2020-05-27 NOTE — ED Notes (Signed)
Pt given breakfast, denies any abdominal pain, NAD.

## 2020-05-27 NOTE — ED Notes (Signed)
[facility] arrived for pt tans[place]. Pt discharged from this ED in stable condition at this time. All discharge instructions and follow up care reviewed with pt with no further questions at this time. Pt ambulatory with steady gait, clear speech.

## 2021-11-16 IMAGING — CT CT ABD-PELV W/ CM
2 of 5 series · 17 of 46 positions shown, 19 images · IV contrast (Omnipaque)
Comparison: None.

CLINICAL DATA: Upper abdominal pain with nausea and vomiting.

EXAM:
CT ABDOMEN AND PELVIS WITH CONTRAST
TECHNIQUE: Multidetector CT imaging of the abdomen and pelvis was performed
using the standard protocol following bolus administration of
intravenous contrast.
CONTRAST:  100mL OMNIPAQUE IOHEXOL 300 MG/ML  SOLN

[Series 2: axial st · axial · 0.96mm/px · z∈[-489,-69]mm · 14 of 94 slices shown, 16 images]
[im 5/94  soft-tissue]
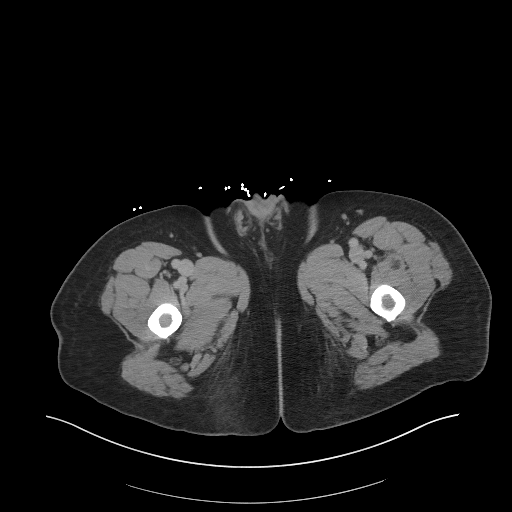
[im 5/94  bone]
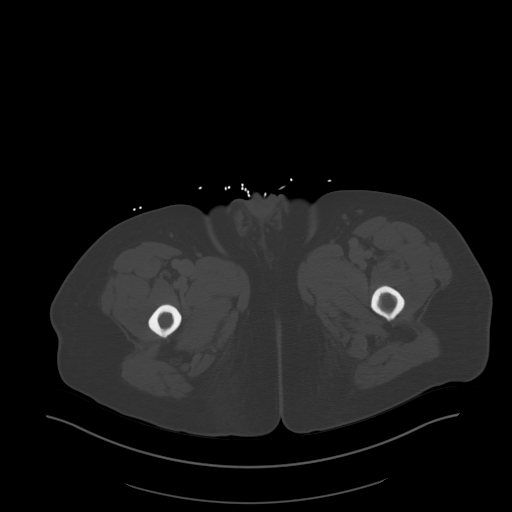
[im 10/94  soft-tissue]
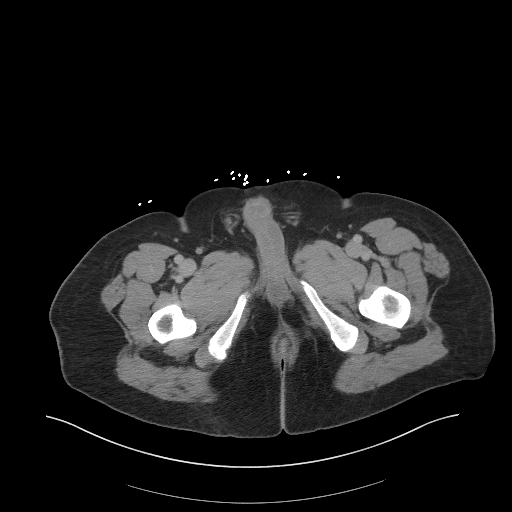
[im 20/94  soft-tissue]
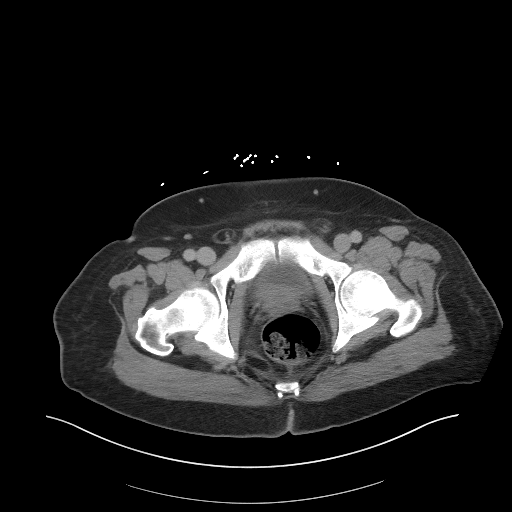
[im 25/94  soft-tissue]
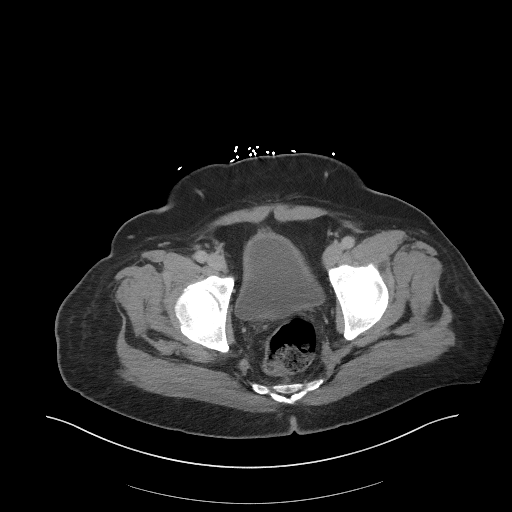
[im 30/94  soft-tissue]
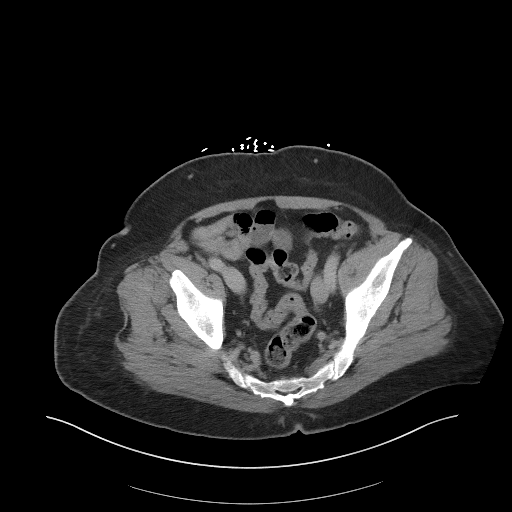
[im 40/94  soft-tissue]
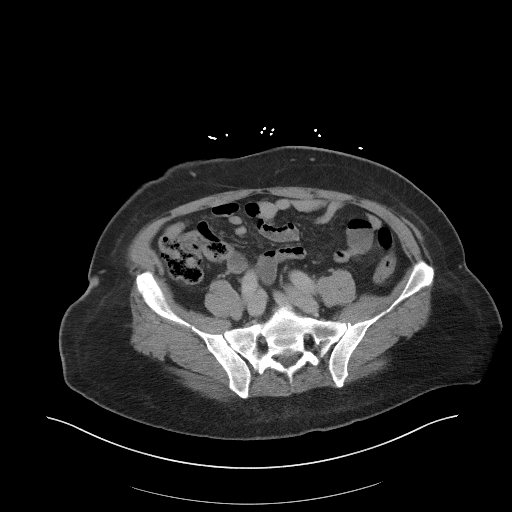
[im 45/94  soft-tissue]
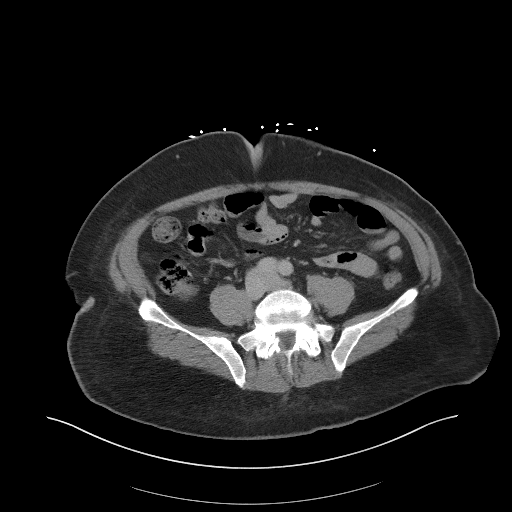
[im 49/94  soft-tissue]
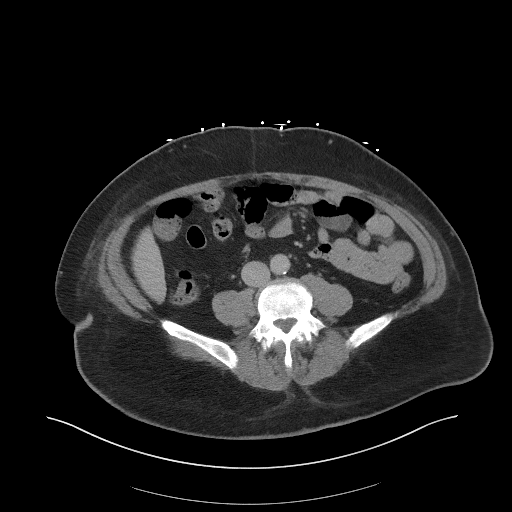
[im 54/94  soft-tissue]
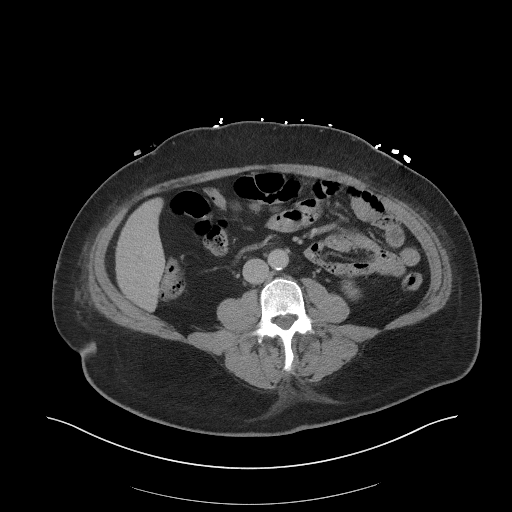
[im 54/94  bone]
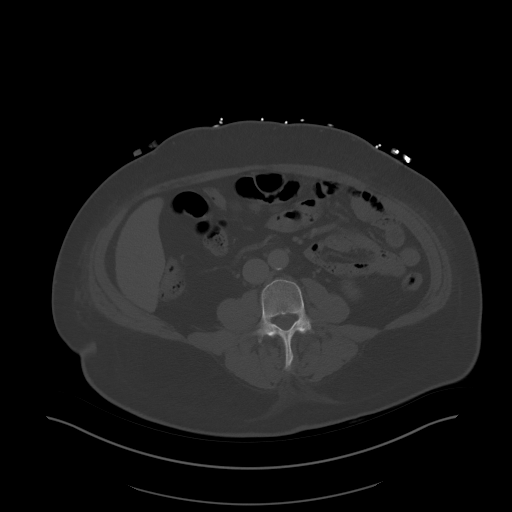
[im 64/94  soft-tissue]
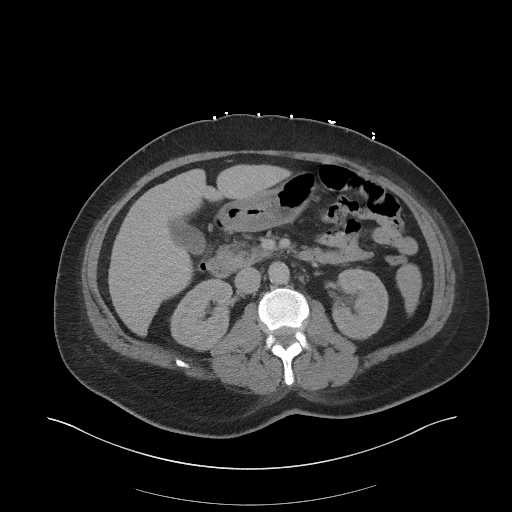
[im 69/94  soft-tissue]
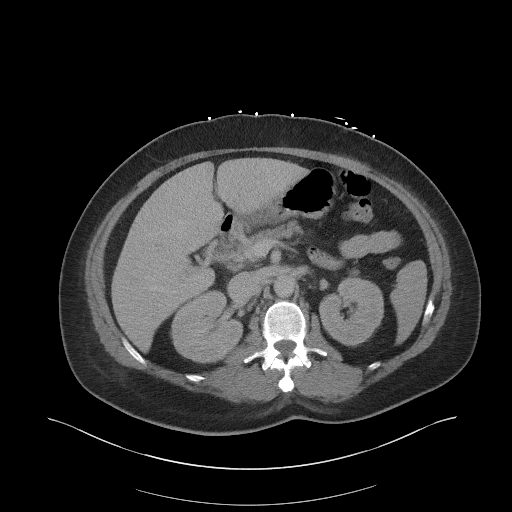
[im 74/94  soft-tissue]
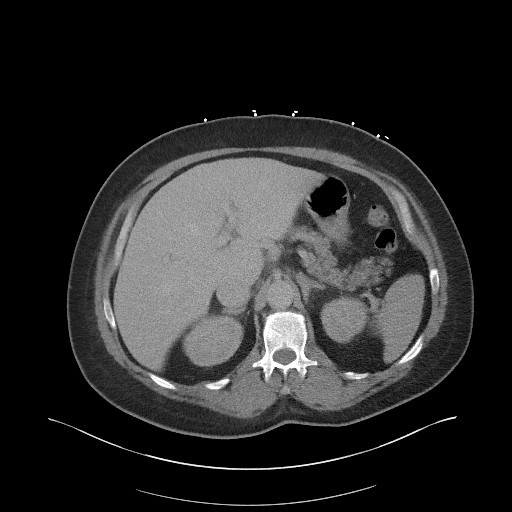
[im 84/94  soft-tissue]
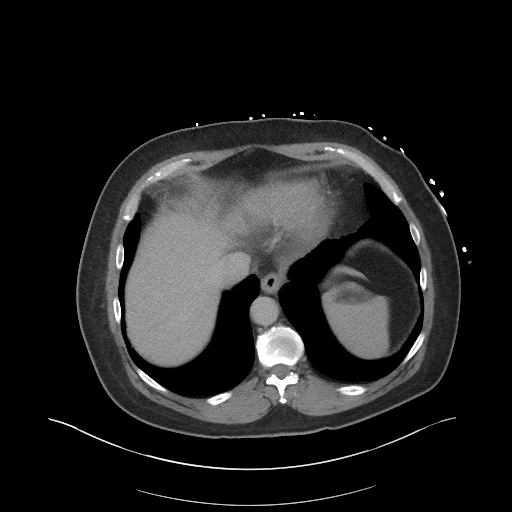
[im 89/94  soft-tissue]
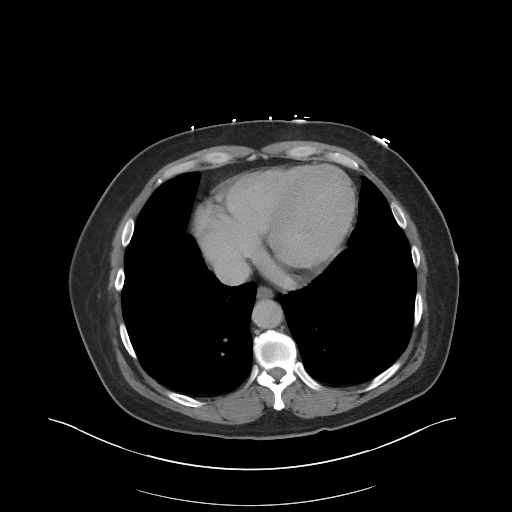

[Series 5: coronal st · coronal · 0.86mm/px · 3 of 103 slices shown]
[im 35/103  soft-tissue]
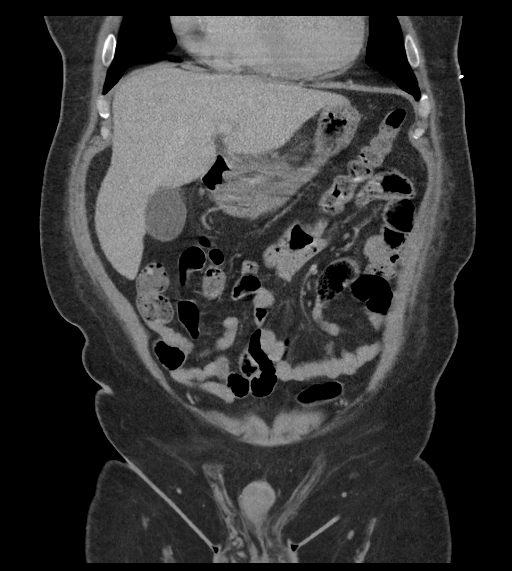
[im 46/103  soft-tissue]
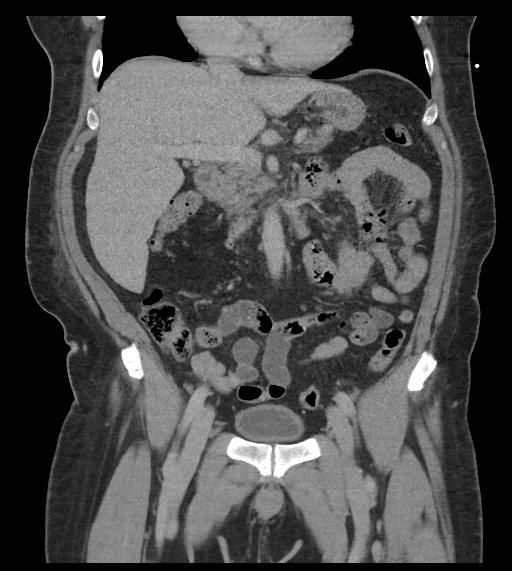
[im 57/103  soft-tissue]
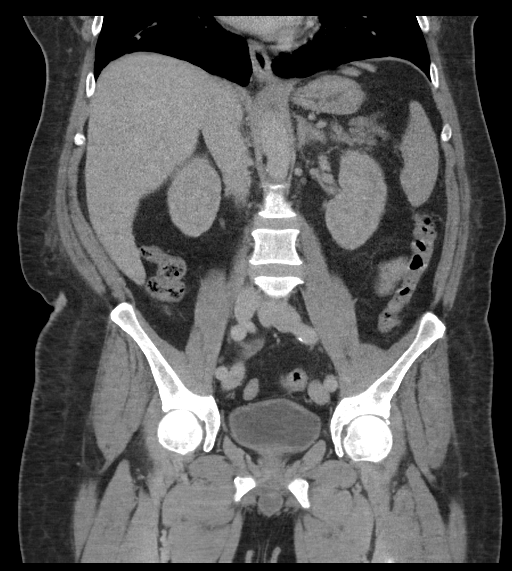

[17 of 46 positions shown; findings below may reference images not displayed]

FINDINGS: Lower chest: No acute abnormality.

Hepatobiliary: No focal liver abnormality is seen. No gallstones,
gallbladder wall thickening, or biliary dilatation.

Pancreas: Unremarkable. No pancreatic ductal dilatation or
surrounding inflammatory changes.

Spleen: Normal in size without focal abnormality.

Adrenals/Urinary Tract: Adrenal glands are unremarkable. Kidneys are
normal, without renal calculi, focal lesion, or hydronephrosis.
Bladder is unremarkable.

Stomach/Bowel: Stomach is within normal limits. The appendix is not
clearly identified. No evidence of bowel wall thickening,
distention, or inflammatory changes.

Vascular/Lymphatic: There is mild calcification of the abdominal
aorta and bilateral common iliac arteries without evidence of
aneurysmal dilatation. No enlarged abdominal or pelvic lymph nodes.

Reproductive: Prostate is unremarkable.

Other: No abdominal wall hernia or abnormality. No abdominopelvic
ascites.

Musculoskeletal: No acute or significant osseous findings.
IMPRESSION: 1. No acute intra-abdominal findings.
2. Aortic atherosclerosis.

Aortic Atherosclerosis ([id number]).
